# Patient Record
Sex: Male | Born: 1965 | Race: White | Hispanic: No | Marital: Married | Smoking: Never smoker
Health system: Southern US, Community
[De-identification: ages and names within clinical notes are randomized; demographics above are authoritative.]

## PROBLEM LIST (undated history)

## (undated) DIAGNOSIS — T7840XA Allergy, unspecified, initial encounter: Secondary | ICD-10-CM

## (undated) DIAGNOSIS — R200 Anesthesia of skin: Secondary | ICD-10-CM

## (undated) DIAGNOSIS — M545 Low back pain, unspecified: Secondary | ICD-10-CM

## (undated) DIAGNOSIS — G2581 Restless legs syndrome: Secondary | ICD-10-CM

## (undated) HISTORY — DX: Low back pain, unspecified: M54.50

## (undated) HISTORY — PX: TONSILLECTOMY: SHX5217

## (undated) HISTORY — DX: Anesthesia of skin: R20.0

## (undated) HISTORY — DX: Restless legs syndrome: G25.81

## (undated) HISTORY — DX: Allergy, unspecified, initial encounter: T78.40XA

## (undated) HISTORY — PX: OTHER SURGICAL HISTORY: SHX169

## (undated) HISTORY — DX: Low back pain: M54.5

---

## 1999-04-13 ENCOUNTER — Encounter: Admission: RE | Admit: 1999-04-13 | Discharge: 1999-05-11 | Payer: Self-pay | Admitting: Family Medicine

## 2004-02-12 ENCOUNTER — Ambulatory Visit: Payer: Self-pay | Admitting: Family Medicine

## 2004-02-18 ENCOUNTER — Ambulatory Visit: Payer: Self-pay | Admitting: Family Medicine

## 2004-02-22 ENCOUNTER — Ambulatory Visit: Payer: Self-pay | Admitting: Family Medicine

## 2004-07-12 ENCOUNTER — Ambulatory Visit: Payer: Self-pay | Admitting: Family Medicine

## 2004-11-07 ENCOUNTER — Ambulatory Visit: Payer: Self-pay | Admitting: Family Medicine

## 2005-03-29 ENCOUNTER — Ambulatory Visit: Payer: Self-pay | Admitting: Family Medicine

## 2005-04-10 ENCOUNTER — Ambulatory Visit: Payer: Self-pay | Admitting: Family Medicine

## 2005-04-10 ENCOUNTER — Encounter: Admission: RE | Admit: 2005-04-10 | Discharge: 2005-04-10 | Payer: Self-pay | Admitting: Family Medicine

## 2005-09-14 ENCOUNTER — Ambulatory Visit: Payer: Self-pay | Admitting: Family Medicine

## 2006-10-08 DIAGNOSIS — M545 Low back pain, unspecified: Secondary | ICD-10-CM | POA: Insufficient documentation

## 2006-12-17 ENCOUNTER — Telehealth: Payer: Self-pay | Admitting: Family Medicine

## 2006-12-21 ENCOUNTER — Telehealth: Payer: Self-pay | Admitting: Family Medicine

## 2007-01-22 ENCOUNTER — Ambulatory Visit: Payer: Self-pay | Admitting: Family Medicine

## 2007-01-22 LAB — CONVERTED CEMR LAB
AST: 22 units/L (ref 0–37)
Albumin: 4 g/dL (ref 3.5–5.2)
Alkaline Phosphatase: 42 units/L (ref 39–117)
Basophils Absolute: 0 10*3/uL (ref 0.0–0.1)
Bilirubin, Direct: 0.1 mg/dL (ref 0.0–0.3)
Blood in Urine, dipstick: NEGATIVE
CO2: 31 meq/L (ref 19–32)
Cholesterol: 195 mg/dL (ref 0–200)
Eosinophils Relative: 1.7 % (ref 0.0–5.0)
GFR calc Af Amer: 106 mL/min
HCT: 40.9 % (ref 39.0–52.0)
MCHC: 35.2 g/dL (ref 30.0–36.0)
Monocytes Relative: 8.1 % (ref 3.0–11.0)
Neutrophils Relative %: 54.4 % (ref 43.0–77.0)
RBC: 4.66 M/uL (ref 4.22–5.81)
Sodium: 140 meq/L (ref 135–145)
Specific Gravity, Urine: 1.025
TSH: 1.64 microintl units/mL (ref 0.35–5.50)
Total Bilirubin: 0.7 mg/dL (ref 0.3–1.2)
Total Protein: 6.9 g/dL (ref 6.0–8.3)
Triglycerides: 112 mg/dL (ref 0–149)
pH: 7

## 2007-02-08 ENCOUNTER — Ambulatory Visit: Payer: Self-pay | Admitting: Family Medicine

## 2007-02-08 DIAGNOSIS — J309 Allergic rhinitis, unspecified: Secondary | ICD-10-CM | POA: Insufficient documentation

## 2007-02-08 DIAGNOSIS — G2581 Restless legs syndrome: Secondary | ICD-10-CM | POA: Insufficient documentation

## 2007-02-15 ENCOUNTER — Telehealth: Payer: Self-pay | Admitting: Family Medicine

## 2007-12-03 ENCOUNTER — Ambulatory Visit: Payer: Self-pay | Admitting: Family Medicine

## 2007-12-19 IMAGING — CT CT PARANASAL SINUSES LIMITED
1 series · 16 of 28 positions shown, 20 images · IV contrast (agent unspecified)
Comparison: none

CLINICAL DATA: Cough for several weeks. 
 LIMITED CT SCAN OF THE SINUSES WITHOUT CONTRAST:
TECHNIQUE: Limited prone coronal CT images were obtained through the paranasal sinuses without intravenous contrast.

[Series 2: limited sinus · axial · 0.33mm/px · z∈[+18,+116]mm · 16 of 28 slices shown, 20 images]
[im 2/28  brain]
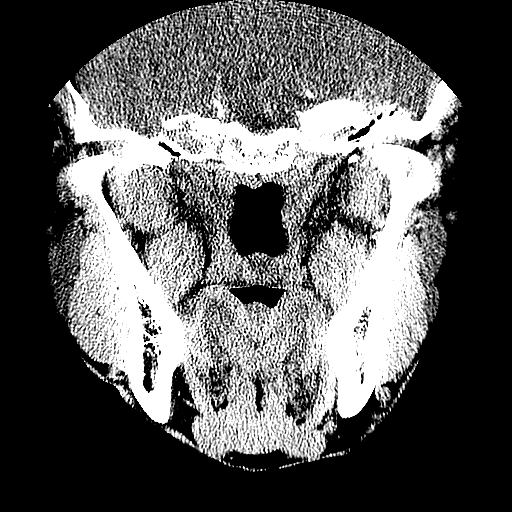
[im 2/28  bone]
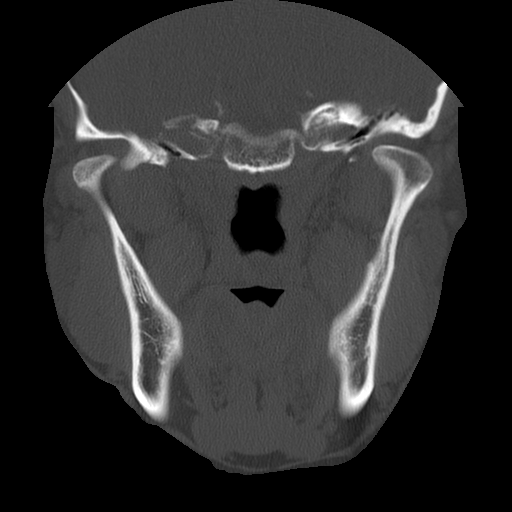
[im 4/28  bone]
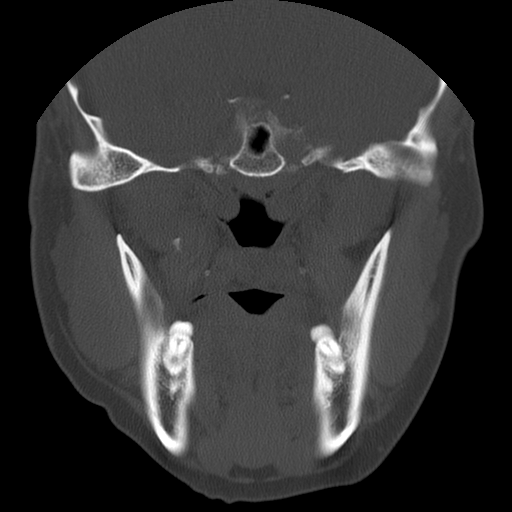
[im 6/28  bone]
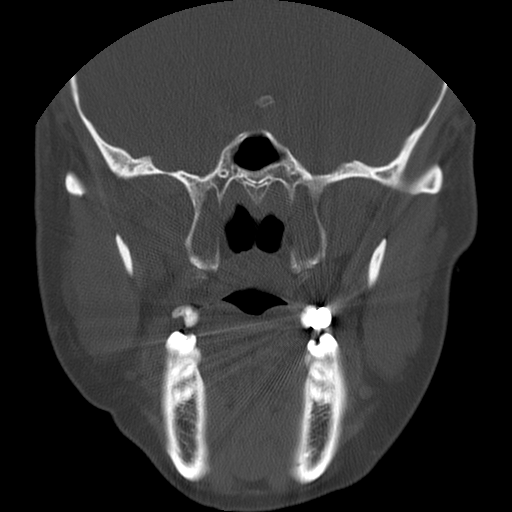
[im 7/28  bone]
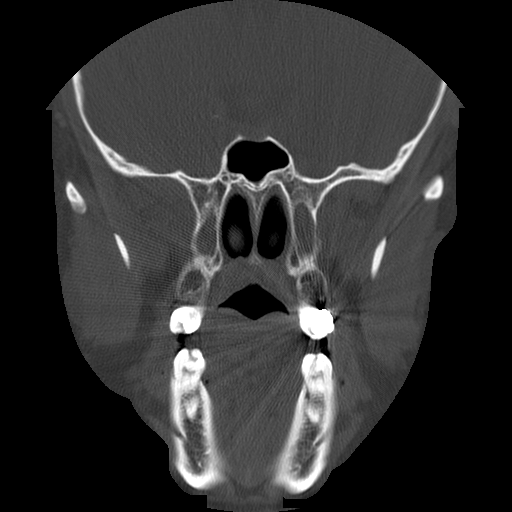
[im 9/28  brain]
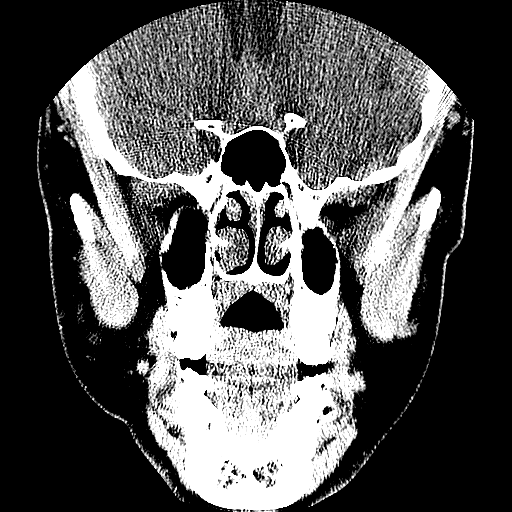
[im 9/28  bone]
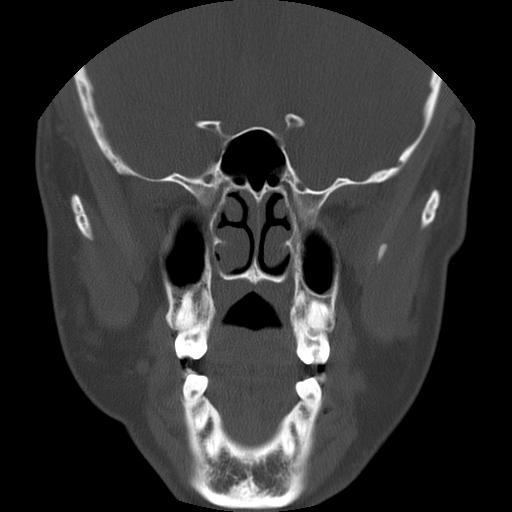
[im 10/28  bone]
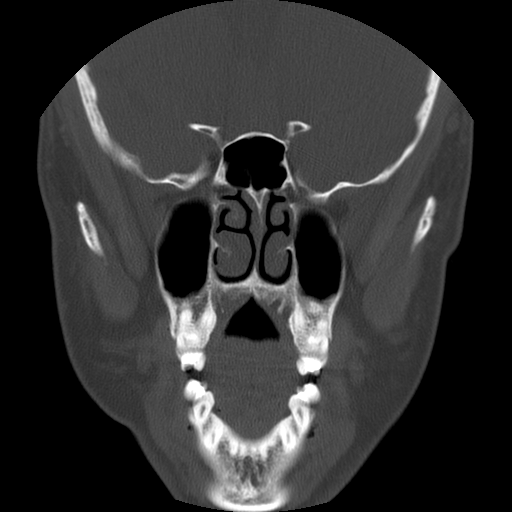
[im 12/28  bone]
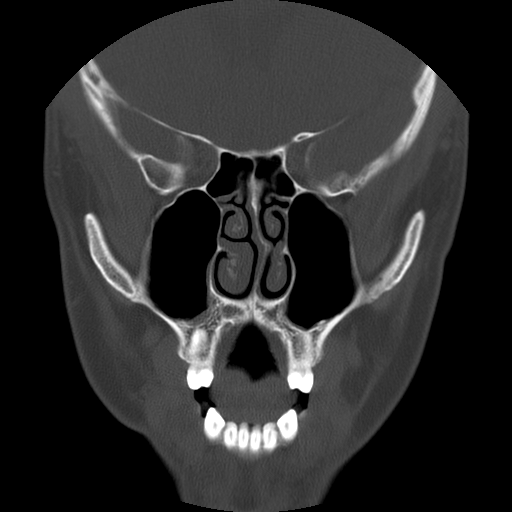
[im 14/28  bone]
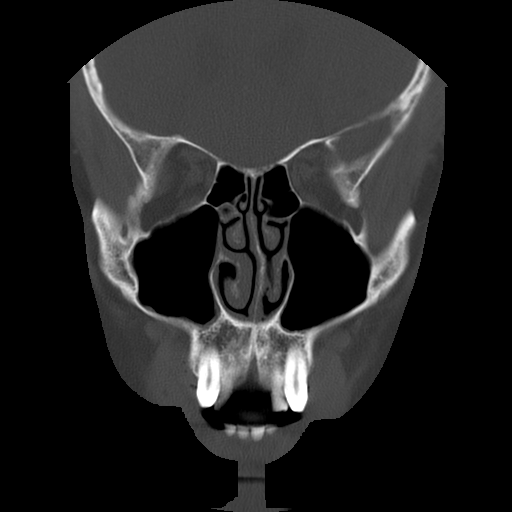
[im 15/28  brain]
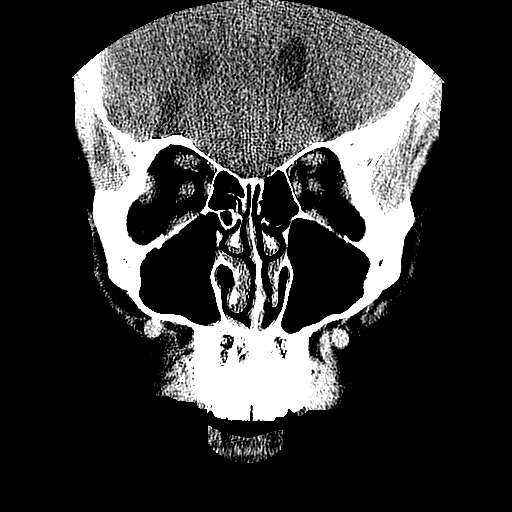
[im 15/28  bone]
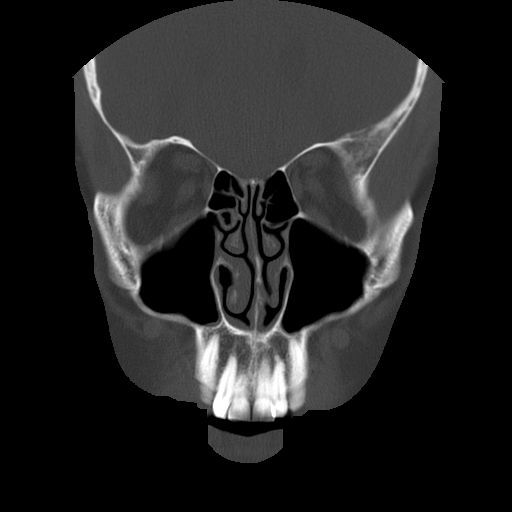
[im 17/28  bone]
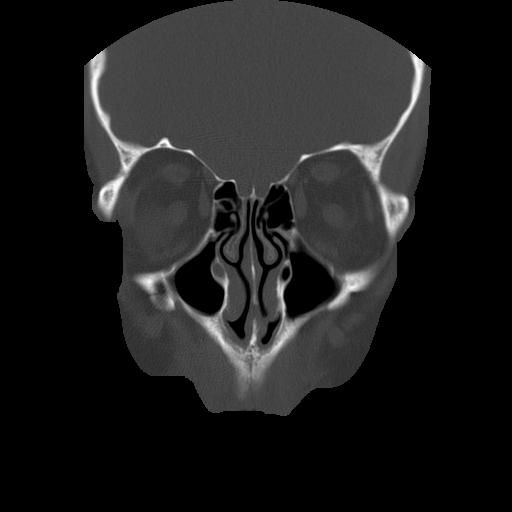
[im 19/28  bone]
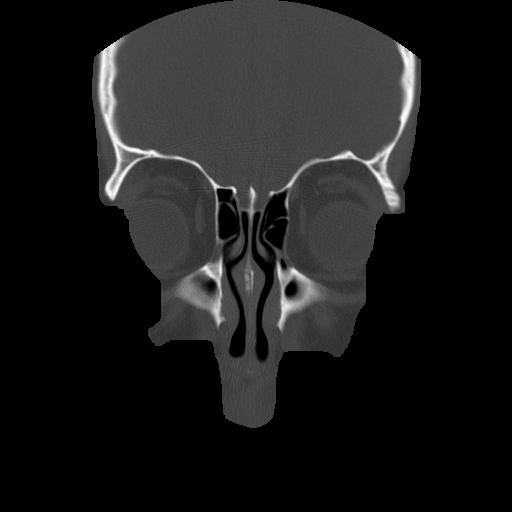
[im 20/28  bone]
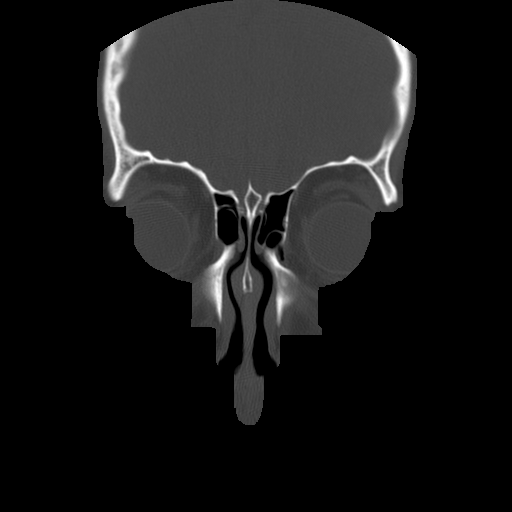
[im 22/28  brain]
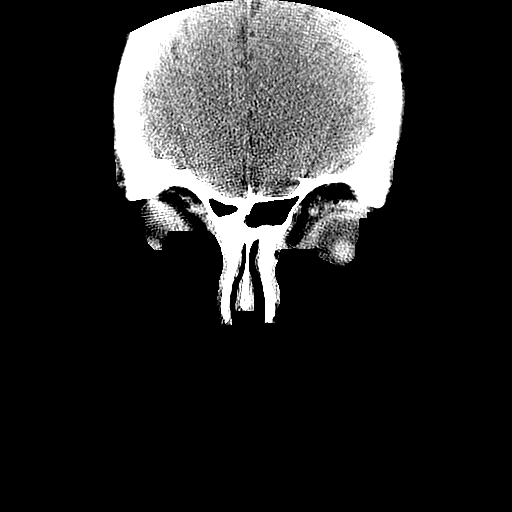
[im 22/28  bone]
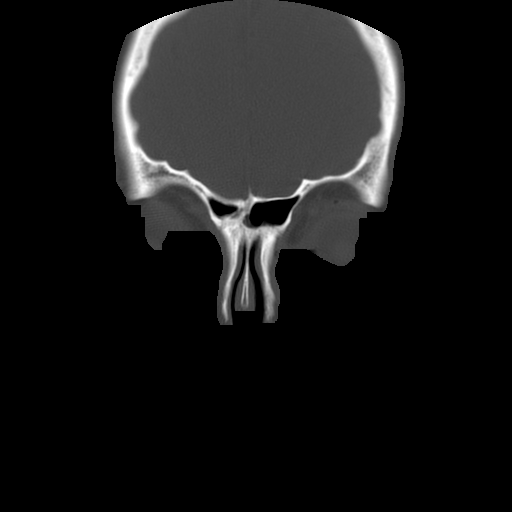
[im 23/28  bone]
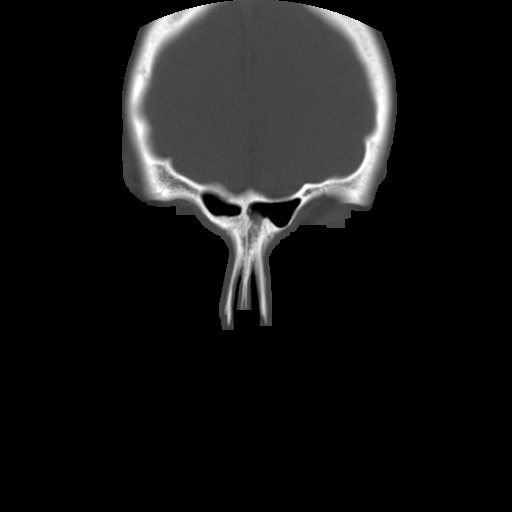
[im 25/28  bone]
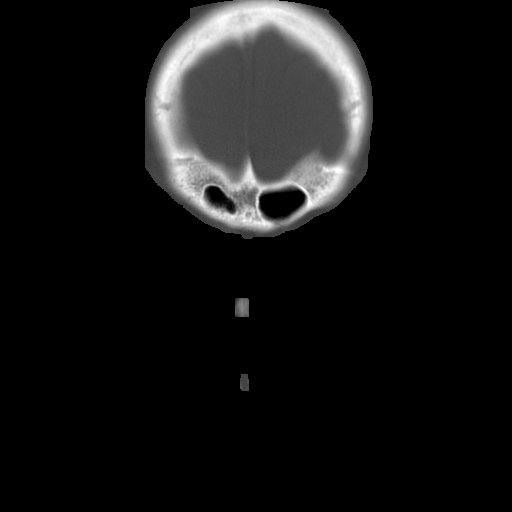
[im 27/28  bone]
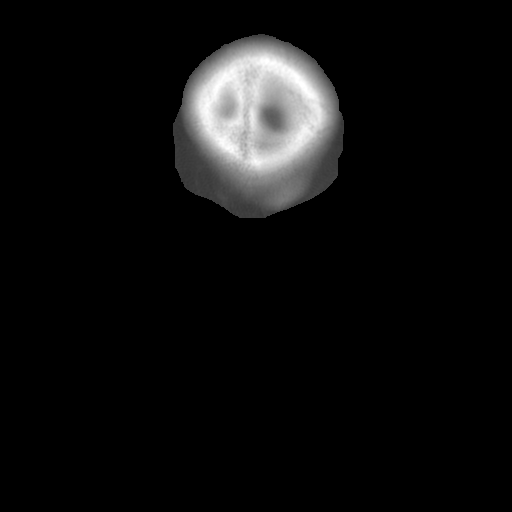

[16 of 28 positions shown; findings below may reference images not displayed]

FINDINGS: There is mucosal thickening in the left maxillary sinus and within a few left ethmoid air cells.  However, no evidence of sinusitis is seen.  The nasal turbinates are normal in size and position, and there is only slight nasal deviation to the right of midline.
IMPRESSION: Mild mucosal thickening in the floor of the left maxillary sinus.  No sinusitis.

## 2008-09-08 ENCOUNTER — Telehealth: Payer: Self-pay | Admitting: Family Medicine

## 2009-12-17 ENCOUNTER — Ambulatory Visit: Payer: Self-pay | Admitting: Family Medicine

## 2009-12-21 ENCOUNTER — Telehealth: Payer: Self-pay | Admitting: Family Medicine

## 2010-03-03 NOTE — Progress Notes (Signed)
Summary: pt no better  Call back at [phone]   Caller: Patient Call For: Roderick Pee MD Summary of Call: Pt was seen on 12-17-2009. pt is not better please advise. [facility] [phone] Initial call taken by: Heron Sabins,  December 21, 2009 3:59 PM  Follow-up for Phone Call       Follow-up by: Roderick Pee MD,  December 21, 2009 6:23 PM  Additional Follow-up for Phone Call Additional follow up Details #1::       Additional Follow-up by: Roderick Pee MD,  December 21, 2009 6:23 PM    Additional Follow-up for Phone Call Additional follow up Details #2::    he states that his cough now is worse.  He has a fever, sputum production, et Karie Soda.  Advised to take a half a teaspoon of Hydromet 3 to 4 times daily.  Increase the prednisone to 20 mg x 3 days and increase to 40 mg over the weekend if he doesn't see improvement in the next couple days.  Call p.r.n. Follow-up by: Roderick Pee MD,  December 21, 2009 6:24 PM

## 2010-03-03 NOTE — Assessment & Plan Note (Signed)
Summary: sinus congestion/dm   Vital Signs:  Patient profile:   45 year old male Weight:      216 pounds Temp:     97.4 degrees F oral BP sitting:   110 / 80  (left arm) Cuff size:   regular  Vitals Entered By: Kern Reap CMA Duncan Dull) (December 17, 2009 3:19 PM) CC: head congestion with deep cough   CC:  head congestion with deep cough.  History of Present Illness: Larry Cunningham is a 45 year old, married male, nonsmoker, who comes in today for evaluation of right ear pain, head congestion, postnasal drip, and cough for 4 weeks.  He typically has some occasional allergic rhinitis, but never this bad.  It is severe.  His ear pain was made worse after an airplane trip  Allergies: No Known Drug Allergies  Past History:  Past medical, surgical, family and social histories (including risk factors) reviewed for relevance to current acute and chronic problems.  Past Medical History: Reviewed history from 02/08/2007 and no changes required. Numbness R Leg Low back pain restless leg syndrome Allergic rhinitis  Past Surgical History: Reviewed history from 10/08/2006 and no changes required. Tubes in Ears Tonsillectomy  Family History: Reviewed history from 10/08/2006 and no changes required. Family History of Arthritis Family History of Asthma Family History of Respiratory disease  Social History: Reviewed history from 02/08/2007 and no changes required. Occupation:business Married Never Smoked Alcohol use-no Drug use-no  Review of Systems      See HPI       Flu Vaccine Consent Questions     Do you have a history of severe allergic reactions to this vaccine? no    Any prior history of allergic reactions to egg and/or gelatin? no    Do you have a sensitivity to the preservative Thimersol? no    Do you have a past history of Guillan-Barre Syndrome? no    Do you currently have an acute febrile illness? no    Have you ever had a severe reaction to latex? no    Vaccine  information given and explained to patient? yes    Are you currently pregnant? no    Lot Number:[id number]   Exp Date:07/30/2010   Site Given  Left Deltoid IM   Physical Exam  General:  Well-developed,well-nourished,in no acute distress; alert,appropriate and cooperative throughout examination Head:  Normocephalic and atraumatic without obvious abnormalities. No apparent alopecia or balding. Eyes:  No corneal or conjunctival inflammation noted. EOMI. Perrla. Funduscopic exam benign, without hemorrhages, exudates or papilledema. Vision grossly normal. Ears:  right TM full fluid.  Left TM normal Nose:  External nasal examination shows no deformity or inflammation. Nasal mucosa are pink and moist without lesions or exudates. Mouth:  Oral mucosa and oropharynx without lesions or exudates.  Teeth in good repair. Neck:  No deformities, masses, or tenderness noted. Chest Wall:  No deformities, masses, tenderness or gynecomastia noted. Lungs:  Normal respiratory effort, chest expands symmetrically. Lungs are clear to auscultation, no crackles or wheezes.   Impression & Recommendations:  Problem # 1:  ALLERGIC RHINITIS (ICD-477.9) Assessment Deteriorated  His updated medication list for this problem includes:    Nasonex 50 Mcg/act Susp (Mometasone furoate) ..... Uad, as needed  uad, as needed  Complete Medication List: 1)  Nasonex 50 Mcg/act Susp (Mometasone furoate) .... Uad, as needed  uad, as needed 2)  Claritin-d 12 Hour 5-120 Mg Xr12h-tab (Loratadine-pseudoephedrine) .... Once daily as needed 3)  Prednisone 20 Mg Tabs (Prednisone) .... Uad 4)  Ropinirole Hcl 1 Mg Tabs (Ropinirole hcl) .... Take one by moth once daily 5)  Hydromet 5-1.5 Mg/45ml Syrp (Hydrocodone-homatropine) .... 1/2 to 1 tsp at bedtime as needed  Other Orders: Admin 1st Vaccine (51025) Flu Vaccine 75yrs + (85277)  Patient Instructions: 1)  take plain Zyrtec at bedtime. 2)  Nasal irrigation with the following.....Marland Kitchen  afrin....... warm salt water..... one shot of steroid nasal spray prior to bedtime.  Remember this a 5 nightly and then on the afrin 3)  Prednisone one tab x 3 days, a half a tablet x 3 days, then half a tablet Monday, Wednesday, Friday, for a two week taper.  Return p.r.n. Prescriptions: HYDROMET 5-1.5 MG/5ML SYRP (HYDROCODONE-HOMATROPINE) 1/2 to 1 tsp at bedtime as needed  #8oz x 1   Entered and Authorized by:   Roderick Pee MD   Signed by:   Roderick Pee MD on 12/17/2009   Method used:   Print then Give to Patient   RxID:   [id number] ROPINIROLE HCL 1 MG TABS (ROPINIROLE HCL) Take one by moth once daily  #100 x 4   Entered and Authorized by:   Roderick Pee MD   Signed by:   Roderick Pee MD on 12/17/2009   Method used:   Electronically to        [facility]  [phone]* (retail)       [address] [place], [place]  [address]       Ph: [phone] or [phone]       Fax: [phone]   RxID:   [id number] PREDNISONE 20 MG TABS (PREDNISONE) UAD  #50 x 1   Entered and Authorized by:   Roderick Pee MD   Signed by:   Roderick Pee MD on 12/17/2009   Method used:   Electronically to        [facility]  [phone]* (retail)       [address] [place], [place]  [address]       Ph: [phone] or [phone]       Fax: [phone]   RxID:   [id number] NASONEX 50 MCG/ACT  SUSP (MOMETASONE FUROATE) UAD, as needed  UAD, as needed  #2 x 4   Entered and Authorized by:   Roderick Pee MD   Signed by:   Roderick Pee MD on 12/17/2009   Method used:   Electronically to        [facility]  [phone]* (retail)       [address] [place], [place]  [address]       Ph: [phone] or [phone]       Fax: [phone]   RxID:   [id number]    Orders Added: 1)  Admin 1st Vaccine [90471] 2)  Flu Vaccine 50yrs + [75916] 3)  Est. Patient Level III [38466]

## 2010-03-16 ENCOUNTER — Encounter: Payer: Self-pay | Admitting: Family Medicine

## 2010-03-17 ENCOUNTER — Ambulatory Visit (INDEPENDENT_AMBULATORY_CARE_PROVIDER_SITE_OTHER): Payer: Self-pay | Admitting: Family Medicine

## 2010-03-17 ENCOUNTER — Encounter: Payer: Self-pay | Admitting: Family Medicine

## 2010-03-17 VITALS — BP 120/84 | Temp 98.0°F | Ht 71.0 in | Wt 217.0 lb

## 2010-03-17 DIAGNOSIS — J309 Allergic rhinitis, unspecified: Secondary | ICD-10-CM

## 2010-03-17 MED ORDER — MOMETASONE FUROATE 50 MCG/ACT NA SUSP: 2.0000 | Freq: Two times a day (BID) | NASAL | Status: DC

## 2010-03-17 MED ORDER — MONTELUKAST SODIUM 10 MG PO TABS: 10.0000 mg | ORAL_TABLET | Freq: Every day | ORAL | Status: DC

## 2010-03-17 NOTE — Progress Notes (Signed)
  Subjective:    Patient ID: Larry Cunningham, male    DOB: [birth year], 45 y.o.   MRN: [id number]  Gweneth Fritter  Is a 45 year old, married man nonsmoker, who comes in today for evaluation of allergic rhinitis.  He is currently taking Claritin, 10 mg daily, and since November has had to take two burst and taper his prednisone x 10 days because of severe symptoms.  He would like to discuss any options.  He has no history of asthma.  Environmental review of systems negative. History reviewed. No pertinent past medical history. History reviewed. No pertinent past surgical history.  reports that he has never smoked. He does not have any smokeless tobacco history on file. He reports that he drinks alcohol. He reports that he does not use illicit drugs. family history is not on file.       Review of Systems     Objective:   Physical Exam  he is a well-developed, well-nourished, male in no acute distress.  Examination of the HEENT were negative.  Echo, supple.  Thyroid not enlarged       Assessment & Plan:  Allergic rhinitis.  Plan is to stop the OTC Claritin, and try plain Zyrtec at bedtime, and increase the steroid nasal spray to one shot up each nostril twice daily and add Singulair 10 mg daily.

## 2010-03-17 NOTE — Patient Instructions (Signed)
Stop the Claritin.  Zyrtec 10 mg plane at bedtime.  Use one shot of the steroid nasal spray twice daily.  Singulair 10 mg daily.  Burst and taper of prednisone 3 to 4 times a year as needed

## 2010-05-24 ENCOUNTER — Telehealth: Payer: Self-pay | Admitting: Family Medicine

## 2010-05-24 MED ORDER — ROPINIROLE HCL 1 MG PO TABS: 1.0000 mg | ORAL_TABLET | Freq: Every day | ORAL | Status: DC

## 2010-05-24 NOTE — Telephone Encounter (Signed)
Pt called req script for generic Requip 1mg  to be sent to [facility]  Fax # [phone]  Id # [phone]

## 2010-05-24 NOTE — Telephone Encounter (Signed)
Pt also would like a 30 day supply for generic Requip to be called in [facility].

## 2010-10-19 ENCOUNTER — Telehealth: Payer: Self-pay | Admitting: Family Medicine

## 2010-10-19 NOTE — Telephone Encounter (Signed)
Wants to get a new rx for Testosterone. [facility]. Thanks.

## 2010-10-20 MED ORDER — TESTOSTERONE 20.25 MG/ACT (1.62%) TD GEL: 2.0000 | TRANSDERMAL | Status: DC

## 2010-10-20 NOTE — Telephone Encounter (Signed)
Ok  X 5 m0nths

## 2010-12-01 ENCOUNTER — Other Ambulatory Visit: Payer: Self-pay | Admitting: *Deleted

## 2010-12-01 MED ORDER — TESTOSTERONE 10 MG/ACT (2%) TD GEL: 2.0000 | Freq: Every day | TRANSDERMAL | Status: DC

## 2011-01-13 ENCOUNTER — Ambulatory Visit (INDEPENDENT_AMBULATORY_CARE_PROVIDER_SITE_OTHER): Payer: BC Managed Care – PPO | Admitting: Family Medicine

## 2011-01-13 ENCOUNTER — Encounter: Payer: Self-pay | Admitting: Family Medicine

## 2011-01-13 DIAGNOSIS — G2581 Restless legs syndrome: Secondary | ICD-10-CM

## 2011-01-13 DIAGNOSIS — R6882 Decreased libido: Secondary | ICD-10-CM

## 2011-01-13 DIAGNOSIS — R5383 Other fatigue: Secondary | ICD-10-CM | POA: Insufficient documentation

## 2011-01-13 DIAGNOSIS — J309 Allergic rhinitis, unspecified: Secondary | ICD-10-CM

## 2011-01-13 LAB — HEPATIC FUNCTION PANEL
ALT: 17 U/L (ref 0–53)
Albumin: 4.4 g/dL (ref 3.5–5.2)
Bilirubin, Direct: 0.1 mg/dL (ref 0.0–0.3)
Total Protein: 7.2 g/dL (ref 6.0–8.3)

## 2011-01-13 LAB — POCT URINALYSIS DIPSTICK
Glucose, UA: NEGATIVE
Ketones, UA: NEGATIVE
Nitrite, UA: NEGATIVE
Urobilinogen, UA: 0.2

## 2011-01-13 LAB — CBC WITH DIFFERENTIAL/PLATELET
Basophils Relative: 0.7 % (ref 0.0–3.0)
Eosinophils Absolute: 0.1 10*3/uL (ref 0.0–0.7)
HCT: 41.9 % (ref 39.0–52.0)
MCHC: 34.5 g/dL (ref 30.0–36.0)
Monocytes Absolute: 0.4 10*3/uL (ref 0.1–1.0)
Neutro Abs: 3.7 10*3/uL (ref 1.4–7.7)
Neutrophils Relative %: 57.3 % (ref 43.0–77.0)
Platelets: 293 10*3/uL (ref 150.0–400.0)
RBC: 4.69 Mil/uL (ref 4.22–5.81)
RDW: 12.4 % (ref 11.5–14.6)

## 2011-01-13 LAB — BASIC METABOLIC PANEL
CO2: 33 mEq/L — ABNORMAL HIGH (ref 19–32)
Chloride: 102 mEq/L (ref 96–112)
Creatinine, Ser: 1 mg/dL (ref 0.4–1.5)
GFR: 89.21 mL/min (ref >60.00)

## 2011-01-13 LAB — LIPID PANEL: VLDL: 22 mg/dL (ref 0.0–40.0)

## 2011-01-13 LAB — LDL CHOLESTEROL, DIRECT: Direct LDL: 133.8 mg/dL

## 2011-01-13 MED ORDER — PREDNISONE 20 MG PO TABS: ORAL_TABLET | ORAL | Status: DC

## 2011-01-13 MED ORDER — MONTELUKAST SODIUM 10 MG PO TABS: 10.0000 mg | ORAL_TABLET | Freq: Every day | ORAL | Status: DC

## 2011-01-13 MED ORDER — ROPINIROLE HCL 1 MG PO TABS: 1.0000 mg | ORAL_TABLET | Freq: Every day | ORAL | Status: DC

## 2011-01-13 MED ORDER — MOMETASONE FUROATE 50 MCG/ACT NA SUSP: 2.0000 | Freq: Two times a day (BID) | NASAL | Status: DC

## 2011-01-13 NOTE — Progress Notes (Signed)
  Subjective:    Patient ID: Larry Cunningham, male    DOB: [birth year], 45 y.o.   MRN: [id number]  HPI Larry Cunningham Is a 45 year old male, who comes in today for evaluation of fatigue, decreased libido, and decreased sexual function, over the last couple months.  Review of systems otherwise negative.  He uses Nasonex for allergic rhinitis, and Singulair 10 mg daily.  Also, an occasional burst of prednisone that he has asthma.  Had a spell 3 weeks ago took his prednisone and the wheezing went away.  He also takes equip 1 mg at bedtime for restless leg syndrome   Review of Systems    General and urologic review of systems otherwise negative Objective:   Physical Exam  Well-developed well-nourished, male in no acute distress      Assessment & Plan:  Fatigue with decreased libido, check testosterone level

## 2011-01-13 NOTE — Patient Instructions (Signed)
I will call you when I get her lab work back.  Set up a time in January for a 30 minute appointment for general physical exam

## 2011-02-18 ENCOUNTER — Encounter: Payer: Self-pay | Admitting: Family Medicine

## 2011-02-21 ENCOUNTER — Encounter: Payer: Self-pay | Admitting: Family Medicine

## 2011-02-21 ENCOUNTER — Ambulatory Visit (INDEPENDENT_AMBULATORY_CARE_PROVIDER_SITE_OTHER): Payer: BC Managed Care – PPO | Admitting: Family Medicine

## 2011-02-21 DIAGNOSIS — J309 Allergic rhinitis, unspecified: Secondary | ICD-10-CM

## 2011-02-21 DIAGNOSIS — G2581 Restless legs syndrome: Secondary | ICD-10-CM

## 2011-02-21 DIAGNOSIS — Z0000 Encounter for general adult medical examination without abnormal findings: Secondary | ICD-10-CM

## 2011-02-21 NOTE — Progress Notes (Signed)
  Subjective:    Patient ID: MD SMOLA, male    DOB: 1965/08/19, 46 y.o.   MRN: [id number]  HPI [name] he will is a 46 year old, married male, nonsmoker, who comes in today for general physical examination  He has a history of underlying allergic rhinitis, for which he takes steroid nasal spray And Singulair 10 mg daily.  He takes frequent 1 mg at bedtime for restless leg syndrome.  There was an issue with fatigue.  He did have a look.  Testosterone level, but then on repeat in December.  His testosterone level was normal   Review of Systems  Constitutional: Negative.   HENT: Negative.   Eyes: Negative.   Respiratory: Negative.   Cardiovascular: Negative.   Gastrointestinal: Negative.   Genitourinary: Negative.   Musculoskeletal: Negative.   Skin: Negative.   Neurological: Negative.   Hematological: Negative.   Psychiatric/Behavioral: Negative.        Objective:   Physical Exam  Constitutional: He is oriented to person, place, and time. He appears well-developed and well-nourished.  HENT:  Head: Normocephalic and atraumatic.  Right Ear: External ear normal.  Left Ear: External ear normal.  Nose: Nose normal.  Mouth/Throat: Oropharynx is clear and moist.  Eyes: Conjunctivae and EOM are normal. Pupils are equal, round, and reactive to light.  Neck: Normal range of motion. Neck supple. No JVD present. No tracheal deviation present. No thyromegaly present.  Cardiovascular: Normal rate, regular rhythm, normal heart sounds and intact distal pulses.  Exam reveals no gallop and no friction rub.   No murmur heard. Pulmonary/Chest: Effort normal and breath sounds normal. No stridor. No respiratory distress. He has no wheezes. He has no rales. He exhibits no tenderness.  Abdominal: Soft. Bowel sounds are normal. He exhibits no distension and no mass. There is no tenderness. There is no rebound and no guarding.  Genitourinary: Rectum normal, prostate normal and penis normal. Guaiac  negative stool. No penile tenderness.  Musculoskeletal: Normal range of motion. He exhibits no edema and no tenderness.  Lymphadenopathy:    He has no cervical adenopathy.  Neurological: He is alert and oriented to person, place, and time. He has normal reflexes. No cranial nerve deficit. He exhibits normal muscle tone.  Skin: Skin is warm and dry. No rash noted. No erythema. No pallor.  Psychiatric: He has a normal mood and affect. His behavior is normal. Judgment and thought content normal.          Assessment & Plan:  Healthy male.  Allergic rhinitis.  Continue Nasonex, and Singulair.  History of restless leg syndrome continue Requip.  Return in one year, sooner if any problems

## 2011-02-21 NOTE — Patient Instructions (Signed)
Continue your current medications.  Follow-up in one year, sooner if any problems 

## 2011-06-13 ENCOUNTER — Other Ambulatory Visit: Payer: Self-pay | Admitting: Family Medicine

## 2012-01-02 ENCOUNTER — Other Ambulatory Visit: Payer: Self-pay | Admitting: Family Medicine

## 2012-06-03 ENCOUNTER — Other Ambulatory Visit: Payer: Self-pay | Admitting: Family Medicine

## 2013-10-30 ENCOUNTER — Other Ambulatory Visit (INDEPENDENT_AMBULATORY_CARE_PROVIDER_SITE_OTHER): Payer: BC Managed Care – PPO

## 2013-10-30 DIAGNOSIS — Z0000 Encounter for general adult medical examination without abnormal findings: Secondary | ICD-10-CM

## 2013-10-30 LAB — CBC WITH DIFFERENTIAL/PLATELET
Basophils Absolute: 0 10*3/uL (ref 0.0–0.1)
Basophils Relative: 0.4 % (ref 0.0–3.0)
EOS PCT: 2 % (ref 0.0–5.0)
Eosinophils Absolute: 0.1 10*3/uL (ref 0.0–0.7)
HCT: 40.2 % (ref 39.0–52.0)
HEMOGLOBIN: 13.7 g/dL (ref 13.0–17.0)
LYMPHS PCT: 36.2 % (ref 12.0–46.0)
Lymphs Abs: 1.5 10*3/uL (ref 0.7–4.0)
MCHC: 34.1 g/dL (ref 30.0–36.0)
MCV: 89.3 fl (ref 78.0–100.0)
MONOS PCT: 6.8 % (ref 3.0–12.0)
Monocytes Absolute: 0.3 10*3/uL (ref 0.1–1.0)
NEUTROS PCT: 54.6 % (ref 43.0–77.0)
Neutro Abs: 2.3 10*3/uL (ref 1.4–7.7)
Platelets: 292 10*3/uL (ref 150.0–400.0)
RBC: 4.5 Mil/uL (ref 4.22–5.81)
RDW: 13.6 % (ref 11.5–15.5)
WBC: 4.1 10*3/uL (ref 4.0–10.5)

## 2013-10-30 LAB — POCT URINALYSIS DIPSTICK
Bilirubin, UA: NEGATIVE
Blood, UA: NEGATIVE
Glucose, UA: NEGATIVE
Ketones, UA: NEGATIVE
Leukocytes, UA: NEGATIVE
NITRITE UA: NEGATIVE
PH UA: 7
PROTEIN UA: NEGATIVE
SPEC GRAV UA: 1.02
Urobilinogen, UA: 0.2

## 2013-10-30 LAB — BASIC METABOLIC PANEL
BUN: 20 mg/dL (ref 6–23)
CHLORIDE: 104 meq/L (ref 96–112)
CO2: 28 mEq/L (ref 19–32)
Calcium: 9 mg/dL (ref 8.4–10.5)
Creatinine, Ser: 1.1 mg/dL (ref 0.4–1.5)
GFR: 73.88 mL/min (ref >60.00)
GLUCOSE: 96 mg/dL (ref 70–99)
POTASSIUM: 4.4 meq/L (ref 3.5–5.1)
Sodium: 141 mEq/L (ref 135–145)

## 2013-10-30 LAB — HEPATIC FUNCTION PANEL
ALBUMIN: 4.3 g/dL (ref 3.5–5.2)
ALK PHOS: 37 U/L — AB (ref 39–117)
ALT: 13 U/L (ref 0–53)
AST: 21 U/L (ref 0–37)
Bilirubin, Direct: 0 mg/dL (ref 0.0–0.3)
TOTAL PROTEIN: 7.3 g/dL (ref 6.0–8.3)
Total Bilirubin: 0.5 mg/dL (ref 0.2–1.2)

## 2013-10-30 LAB — LIPID PANEL
Cholesterol: 202 mg/dL — ABNORMAL HIGH (ref 0–200)
HDL: 60.1 mg/dL (ref >39.00)
LDL Cholesterol: 124 mg/dL — ABNORMAL HIGH (ref 0–99)
NonHDL: 141.9
Total CHOL/HDL Ratio: 3
Triglycerides: 92 mg/dL (ref 0.0–149.0)
VLDL: 18.4 mg/dL (ref 0.0–40.0)

## 2013-10-30 LAB — TSH: TSH: 1.3 u[IU]/mL (ref 0.35–4.50)

## 2013-11-10 ENCOUNTER — Encounter: Payer: Self-pay | Admitting: Family Medicine

## 2013-11-10 ENCOUNTER — Ambulatory Visit (INDEPENDENT_AMBULATORY_CARE_PROVIDER_SITE_OTHER): Payer: BC Managed Care – PPO | Admitting: Family Medicine

## 2013-11-10 VITALS — BP 114/88 | HR 88 | Temp 97.6°F | Ht 70.0 in | Wt 207.4 lb

## 2013-11-10 DIAGNOSIS — G4733 Obstructive sleep apnea (adult) (pediatric): Secondary | ICD-10-CM | POA: Insufficient documentation

## 2013-11-10 DIAGNOSIS — G4710 Hypersomnia, unspecified: Secondary | ICD-10-CM

## 2013-11-10 DIAGNOSIS — G2581 Restless legs syndrome: Secondary | ICD-10-CM

## 2013-11-10 DIAGNOSIS — G4730 Sleep apnea, unspecified: Secondary | ICD-10-CM

## 2013-11-10 DIAGNOSIS — J301 Allergic rhinitis due to pollen: Secondary | ICD-10-CM

## 2013-11-10 MED ORDER — MOMETASONE FUROATE 50 MCG/ACT NA SUSP: 2.0000 | Freq: Two times a day (BID) | NASAL | Status: AC

## 2013-11-10 MED ORDER — MONTELUKAST SODIUM 10 MG PO TABS: 10.0000 mg | ORAL_TABLET | Freq: Every day | ORAL | Status: AC

## 2013-11-10 MED ORDER — ROPINIROLE HCL 1 MG PO TABS: ORAL_TABLET | ORAL | Status: DC

## 2013-11-10 NOTE — Progress Notes (Signed)
Pre visit review using our clinic review tool, if applicable. No additional management support is needed unless otherwise documented below in the visit note. 

## 2013-11-10 NOTE — Progress Notes (Signed)
   Subjective:    Patient ID: Larry Cunningham, male    DOB: 01/07/1966, 48 y.o.   MRN: [id number]  HPI Larry BonusShad is a 48 year old married male nonsmoker who comes in today for evaluation  He has underlying allergic rhinitis for which he uses steroid nasal spray Singulair 10 mg daily.  He takes Requip 1 mg daily for restless leg syndrome there  Recently over the past couple months he is having more issues with snoring even on his side. His wife is concerned he may have sleep apnea.  He gets routine eye care, dental care, colonoscopy at age 48 no family history colon cancer or polyps   Review of Systems  Constitutional: Negative.   HENT: Negative.   Eyes: Negative.   Respiratory: Negative.   Cardiovascular: Negative.   Gastrointestinal: Negative.   Endocrine: Negative.   Genitourinary: Negative.   Musculoskeletal: Negative.   Skin: Negative.   Allergic/Immunologic: Negative.   Neurological: Negative.   Hematological: Negative.   Psychiatric/Behavioral: Negative.        Objective:   Physical Exam  Nursing note and vitals reviewed. Constitutional: He is oriented to person, place, and time. He appears well-developed and well-nourished.  HENT:  Head: Normocephalic and atraumatic.  Right Ear: External ear normal.  Left Ear: External ear normal.  Nose: Nose normal.  Mouth/Throat: Oropharynx is clear and moist.  Eyes: Conjunctivae and EOM are normal. Pupils are equal, round, and reactive to light.  Neck: Normal range of motion. Neck supple. No JVD present. No tracheal deviation present. No thyromegaly present.  Cardiovascular: Normal rate, regular rhythm, normal heart sounds and intact distal pulses.  Exam reveals no gallop and no friction rub.   No murmur heard. Pulmonary/Chest: Effort normal and breath sounds normal. No stridor. No respiratory distress. He has no wheezes. He has no rales. He exhibits no tenderness.  Abdominal: Soft. Bowel sounds are normal. He exhibits no distension  and no mass. There is no tenderness. There is no rebound and no guarding.  Genitourinary: Rectum normal, prostate normal and penis normal. Guaiac negative stool. No penile tenderness.  Musculoskeletal: Normal range of motion. He exhibits no edema and no tenderness.  Lymphadenopathy:    He has no cervical adenopathy.  Neurological: He is alert and oriented to person, place, and time. He has normal reflexes. No cranial nerve deficit. He exhibits normal muscle tone.  Skin: Skin is warm and dry. No rash noted. No erythema. No pallor.  Psychiatric: He has a normal mood and affect. His behavior is normal. Judgment and thought content normal.          Assessment & Plan:  Healthy male  Allergic rhinitis continue current medications  Restless leg syndrome continue Requip  Question sleep apnea consult with Dr. Shelle Ironclance

## 2013-11-10 NOTE — Patient Instructions (Signed)
Continue your current medications  We will set you up a consult in pulmonary to see Dr. Mellody DanceKeith clance and his associates for evaluation of question sleep apnea

## 2013-12-19 ENCOUNTER — Institutional Professional Consult (permissible substitution): Payer: BC Managed Care – PPO | Admitting: Pulmonary Disease

## 2013-12-30 ENCOUNTER — Ambulatory Visit (INDEPENDENT_AMBULATORY_CARE_PROVIDER_SITE_OTHER): Payer: BC Managed Care – PPO | Admitting: Pulmonary Disease

## 2013-12-30 ENCOUNTER — Encounter: Payer: Self-pay | Admitting: Pulmonary Disease

## 2013-12-30 ENCOUNTER — Encounter (INDEPENDENT_AMBULATORY_CARE_PROVIDER_SITE_OTHER): Payer: Self-pay

## 2013-12-30 DIAGNOSIS — R0683 Snoring: Secondary | ICD-10-CM

## 2013-12-30 NOTE — Progress Notes (Signed)
   Subjective:    Patient ID: Larry Cunningham, male    DOB: 01/20/1966, 48 y.o.   MRN: [id number]  HPI The patient is a 48 year old male who I've been asked to see for possible obstructive sleep apnea. He has been noted to have snoring by his bed partner, as well as an occasional abnormal breathing pattern during sleep. The patient typically only gets approximately 5 hours of sleep a night, and denies having frequent awakenings. Overall, he feels that he is adequately rested in the mornings upon arising, but does admit to having sleep pressure during the day with inactivity at times. He also gets very sleepy in the evenings trying to watch television or movies, and will actually fall asleep. Hedeniesanysleepinesswithshortdrives,but does get sleepy driving longer distances.  Complicating all of this, the patient leads a very busy life with his job, and often travels across multiple times zones with very little sleep. He also has a history of restless leg syndrome, but it no longer bothers his wife since getting a King bed, and it rarely disrupts his sleep.  He has tried Requip in the past, but did not feel that his symptoms were significant enough to justify a nightly medication. Of note, the patient's weight is been stable over the last few years, and his Epworth score today is 7.   Review of Systems  Constitutional: Negative for fever and unexpected weight change.  HENT: Negative for congestion, dental problem, ear pain, nosebleeds, postnasal drip, rhinorrhea, sinus pressure, sneezing, sore throat and trouble swallowing.   Eyes: Negative for redness and itching.  Respiratory: Negative for cough, chest tightness, shortness of breath and wheezing.   Cardiovascular: Negative for palpitations and leg swelling.  Gastrointestinal: Negative for nausea and vomiting.  Genitourinary: Negative for dysuria.  Musculoskeletal: Negative for joint swelling.  Skin: Negative for rash.  Neurological: Negative for  headaches.  Hematological: Does not bruise/bleed easily.  Psychiatric/Behavioral: Negative for dysphoric mood. The patient is not nervous/anxious.        Objective:   Physical Exam Constitutional:  Well developed, no acute distress  HENT:  Nares patent without discharge, mildly narrowed.  Oropharynx without exudate, palate and uvula are mildly elongated.  Eyes:  Perrla, eomi, no scleral icterus  Neck:  No JVD, no TMG  Cardiovascular:  Normal rate, regular rhythm, no rubs or gallops.  No murmurs        Intact distal pulses  Pulmonary :  Normal breath sounds, no stridor or respiratory distress   No rales, rhonchi, or wheezing  Abdominal:  Soft, nondistended, bowel sounds present.  No tenderness noted.   Musculoskeletal:  No lower extremity edema noted.  Lymph Nodes:  No cervical lymphadenopathy noted  Skin:  No cyanosis noted  Neurologic:  Appears mildly sleep, appropriate, moves all 4 extremities without obvious deficit.         Assessment & Plan:

## 2013-12-30 NOTE — Assessment & Plan Note (Signed)
The patient has a history of significant snoring and also witnessed apneas at times. The patient does describe some sleep pressure during the day with inactivity and also in the evenings.  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 It is unclear from his history whether he has clinically significant sleep disordered breathing or not. I think he would benefit from a home sleep test to put the issue to rest, with the main goal being to exclude moderate to severe sleep apnea. The patient is agreeable to this approach.

## 2013-12-30 NOTE — Patient Instructions (Signed)
Will schedule for home sleep testing, and will call once the results are available.  Work on modest weight loss.

## 2014-03-23 ENCOUNTER — Ambulatory Visit (INDEPENDENT_AMBULATORY_CARE_PROVIDER_SITE_OTHER): Payer: BLUE CROSS/BLUE SHIELD | Admitting: Family Medicine

## 2014-03-23 ENCOUNTER — Encounter: Payer: Self-pay | Admitting: Family Medicine

## 2014-03-23 VITALS — BP 140/84 | Temp 97.8°F | Wt 213.0 lb

## 2014-03-23 DIAGNOSIS — B9789 Other viral agents as the cause of diseases classified elsewhere: Principal | ICD-10-CM

## 2014-03-23 DIAGNOSIS — J069 Acute upper respiratory infection, unspecified: Secondary | ICD-10-CM | POA: Insufficient documentation

## 2014-03-23 MED ORDER — HYDROCODONE-HOMATROPINE 5-1.5 MG/5ML PO SYRP: 5.0000 mL | ORAL_SOLUTION | Freq: Three times a day (TID) | ORAL | Status: DC | PRN

## 2014-03-23 MED ORDER — AZITHROMYCIN 250 MG PO TABS: ORAL_TABLET | ORAL | Status: DC

## 2014-03-23 NOTE — Progress Notes (Signed)
Pre visit review using our clinic review tool, if applicable. No additional management support is needed unless otherwise documented below in the visit note. 

## 2014-03-23 NOTE — Progress Notes (Signed)
   Subjective:    Patient ID: Larry Cunningham, male    DOB: 10/15/1965, 49 y.o.   MRN: [id number]  HPI [name] is a 49 year old married male nonsmoker who comes in today with a five-day history of a congestion sore throat and runny nose and cough. He's also had some fever and chills.  He's going to [place] on Sunday business   Review of Systems Review of systems otherwise negative    Objective:   Physical Exam  Well-developed well-nourished male no acute distress vital signs stable he is afebrile HEENT were negative neck was supple no adenopathy lungs are clear      Assessment & Plan:  Viral syndrome with cough.......... treat symptomatically lots of liquids Tylenol and cough syrup

## 2014-03-23 NOTE — Patient Instructions (Signed)
Drink lots of water  Hydromet......Marland Kitchen. 1/2-1 teaspoon 3 times daily. For cough and cold  Take these azithromycin with you on your trip........Marland Kitchen. 1 daily for 10 days when necessary  Also take the prednisone with you just in case she start wheezing  Afrin nasal spray and chewing gum

## 2014-03-25 ENCOUNTER — Telehealth: Payer: Self-pay | Admitting: Pulmonary Disease

## 2014-03-25 NOTE — Telephone Encounter (Signed)
Pt states that he is not going to make it to pick up his home sleep test machine today. Pt was scheduled to meet Alida at 11:00 today to pick up machine. This needs to be rescheduled.  Will send to Alida to contact patient to reschedule.   Please advise, thanks.

## 2014-03-26 NOTE — Telephone Encounter (Signed)
Spoke with patient who had days mixed up was scheduled for today 03/26/14 @ 11am.  [facility] still not feeling well [facility]  .Kandice HamsAlida Crystalann Korf

## 2014-04-06 DIAGNOSIS — G4733 Obstructive sleep apnea (adult) (pediatric): Secondary | ICD-10-CM

## 2014-04-09 ENCOUNTER — Other Ambulatory Visit: Payer: Self-pay | Admitting: *Deleted

## 2014-04-09 ENCOUNTER — Telehealth: Payer: Self-pay | Admitting: Pulmonary Disease

## 2014-04-09 DIAGNOSIS — G4733 Obstructive sleep apnea (adult) (pediatric): Secondary | ICD-10-CM

## 2014-04-09 DIAGNOSIS — R0683 Snoring: Secondary | ICD-10-CM

## 2014-04-09 NOTE — Telephone Encounter (Signed)
Needs ov to review sleep study 

## 2014-04-09 NOTE — Telephone Encounter (Signed)
Pt called back and appt scheduled fro 3/18.

## 2014-04-09 NOTE — Telephone Encounter (Signed)
lmomtcb x1 on both #'s.

## 2014-04-17 ENCOUNTER — Encounter: Payer: Self-pay | Admitting: Pulmonary Disease

## 2014-04-17 ENCOUNTER — Ambulatory Visit (INDEPENDENT_AMBULATORY_CARE_PROVIDER_SITE_OTHER): Payer: BLUE CROSS/BLUE SHIELD | Admitting: Pulmonary Disease

## 2014-04-17 VITALS — BP 132/74 | HR 80 | Temp 97.0°F | Ht 71.0 in | Wt 205.2 lb

## 2014-04-17 DIAGNOSIS — G4733 Obstructive sleep apnea (adult) (pediatric): Secondary | ICD-10-CM

## 2014-04-17 NOTE — Progress Notes (Signed)
   Subjective:    Patient ID: Larry Cunningham, male    DOB: 04/04/1965, 49 y.o.   MRN: [id number]  HPI The patient comes in today for follow-up of his recent home sleep test. He was found to have mild obstructive sleep apnea, with an AHI of 13 events per hour and transient oxygen desaturation. I have reviewed the study with him in detail, and answered all of his questions.   Review of Systems  Constitutional: Negative for fever, chills, activity change, appetite change and unexpected weight change.  HENT: Negative for congestion, dental problem, postnasal drip, rhinorrhea, sneezing, sore throat, trouble swallowing and voice change.   Eyes: Negative for visual disturbance.  Respiratory: Negative for cough, choking and shortness of breath.   Cardiovascular: Negative for chest pain and leg swelling.  Gastrointestinal: Negative for nausea, vomiting and abdominal pain.  Genitourinary: Negative for difficulty urinating.  Musculoskeletal: Negative for arthralgias.  Skin: Negative for rash.  Psychiatric/Behavioral: Negative for behavioral problems and confusion.       Objective:   Physical Exam Overweight male in no acute distress Nose without purulence or discharge noted Neck without lymphadenopathy or thyromegaly Lower extremities without edema, no cyanosis Alert and oriented, moves all 4 extremities.       Assessment & Plan:

## 2014-04-17 NOTE — Assessment & Plan Note (Signed)
The patient's home sleep test shows mild obstructive sleep apnea, and I have reviewed the results with him. Treatment for this degree of sleep apnea should only depend upon its impact to his quality of life, since it is not a significant cardiovascular health issue. I have outlined a conservative approach with a trial of weight loss alone, versus a more aggressive approach with a dental appliance or CPAP. 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 For now, he would like to work on weight loss alone, and if he has difficulties, he will consider a dental appliance. He knows to call me if he wishes a referral to dental medicine.

## 2014-04-17 NOTE — Patient Instructions (Signed)
Work on weight reduction, but call if you wish to consider a dental appliance

## 2014-11-13 ENCOUNTER — Telehealth: Payer: Self-pay | Admitting: Family Medicine

## 2014-11-13 NOTE — Telephone Encounter (Signed)
Pt states he needs cpe by the end of the year.  Pt does not want to switch pcp at this time. Prefers I ask dr todd to see him before the end of the year.  Pt aware dr todd out until Monday and he is ok with that.

## 2014-11-16 NOTE — Telephone Encounter (Signed)
Pt is aware of message

## 2014-11-16 NOTE — Telephone Encounter (Signed)
Left message on voice mail  to call back

## 2014-11-16 NOTE — Telephone Encounter (Signed)
No work-ins per Dr Tawanna Coolerodd.  I can refill his medication if needed until his appointment

## 2017-10-29 DIAGNOSIS — K5730 Diverticulosis of large intestine without perforation or abscess without bleeding: Secondary | ICD-10-CM | POA: Diagnosis not present

## 2017-10-29 DIAGNOSIS — Z1211 Encounter for screening for malignant neoplasm of colon: Secondary | ICD-10-CM | POA: Diagnosis not present

## 2018-01-17 DIAGNOSIS — Z125 Encounter for screening for malignant neoplasm of prostate: Secondary | ICD-10-CM | POA: Diagnosis not present

## 2018-01-17 DIAGNOSIS — Z13220 Encounter for screening for lipoid disorders: Secondary | ICD-10-CM | POA: Diagnosis not present

## 2018-01-17 DIAGNOSIS — Z23 Encounter for immunization: Secondary | ICD-10-CM | POA: Diagnosis not present

## 2018-01-17 DIAGNOSIS — Z0000 Encounter for general adult medical examination without abnormal findings: Secondary | ICD-10-CM | POA: Diagnosis not present

## 2018-08-27 DIAGNOSIS — L0390 Cellulitis, unspecified: Secondary | ICD-10-CM | POA: Diagnosis not present

## 2018-08-27 DIAGNOSIS — T63441A Toxic effect of venom of bees, accidental (unintentional), initial encounter: Secondary | ICD-10-CM | POA: Diagnosis not present

## 2019-01-02 DIAGNOSIS — E7800 Pure hypercholesterolemia, unspecified: Secondary | ICD-10-CM | POA: Diagnosis not present

## 2019-01-02 DIAGNOSIS — Z125 Encounter for screening for malignant neoplasm of prostate: Secondary | ICD-10-CM | POA: Diagnosis not present

## 2019-01-02 DIAGNOSIS — Z0000 Encounter for general adult medical examination without abnormal findings: Secondary | ICD-10-CM | POA: Diagnosis not present

## 2019-03-14 ENCOUNTER — Ambulatory Visit: Payer: BC Managed Care – PPO | Attending: Internal Medicine

## 2019-03-14 DIAGNOSIS — Z23 Encounter for immunization: Secondary | ICD-10-CM | POA: Insufficient documentation

## 2019-03-14 NOTE — Progress Notes (Signed)
   Covid-19 Vaccination Clinic  Name:  PRIMUS GRITTON    MRN: [id number] DOB: 04-15-1965  03/14/2019  Mr. Arzuaga was observed post Covid-19 immunization for 15 minutes without incidence. He was provided with Vaccine Information Sheet and instruction to access the V-Safe system.   Mr. Partin was instructed to call 911 with any severe reactions post vaccine: Marland Kitchen Difficulty breathing  . Swelling of your face and throat  . A fast heartbeat  . A bad rash all over your body  . Dizziness and weakness    Immunizations Administered    Name Date Dose VIS Date Route   Pfizer COVID-19 Vaccine 03/14/2019  4:08 PM 0.3 mL 01/10/2019 Intramuscular   Manufacturer: [employer], [employer]   Lot: [id number]   NDC: [address]-2

## 2019-03-16 ENCOUNTER — Ambulatory Visit: Payer: BC Managed Care – PPO

## 2019-04-07 ENCOUNTER — Ambulatory Visit: Payer: BC Managed Care – PPO | Attending: Internal Medicine

## 2019-04-07 DIAGNOSIS — Z23 Encounter for immunization: Secondary | ICD-10-CM | POA: Insufficient documentation

## 2019-04-07 NOTE — Progress Notes (Signed)
   Covid-19 Vaccination Clinic  Name:  Larry Cunningham    MRN: [id number] DOB: Jun 18, 1965  04/07/2019  Mr. Wedge was observed post Covid-19 immunization for 15 minutes without incident. He was provided with Vaccine Information Sheet and instruction to access the V-Safe system.   [name] was instructed to call 911 with any severe reactions post vaccine: Marland Kitchen Difficulty breathing  . Swelling of face and throat  . A fast heartbeat  . A bad rash all over body  . Dizziness and weakness   Immunizations Administered    Name Date Dose VIS Date Route   Pfizer COVID-19 Vaccine 04/07/2019  8:46 AM 0.3 mL 01/10/2019 Intramuscular   Manufacturer: [employer], [employer]   Lot: [id number]   NDC: [address]-5

## 2020-01-15 DIAGNOSIS — Z23 Encounter for immunization: Secondary | ICD-10-CM | POA: Diagnosis not present

## 2020-01-15 DIAGNOSIS — E7800 Pure hypercholesterolemia, unspecified: Secondary | ICD-10-CM | POA: Diagnosis not present

## 2020-01-15 DIAGNOSIS — Z0000 Encounter for general adult medical examination without abnormal findings: Secondary | ICD-10-CM | POA: Diagnosis not present

## 2020-01-15 DIAGNOSIS — Z131 Encounter for screening for diabetes mellitus: Secondary | ICD-10-CM | POA: Diagnosis not present

## 2020-01-15 DIAGNOSIS — Z125 Encounter for screening for malignant neoplasm of prostate: Secondary | ICD-10-CM | POA: Diagnosis not present

## 2021-01-17 DIAGNOSIS — E7800 Pure hypercholesterolemia, unspecified: Secondary | ICD-10-CM | POA: Diagnosis not present

## 2021-01-17 DIAGNOSIS — Z131 Encounter for screening for diabetes mellitus: Secondary | ICD-10-CM | POA: Diagnosis not present

## 2021-01-17 DIAGNOSIS — Z23 Encounter for immunization: Secondary | ICD-10-CM | POA: Diagnosis not present

## 2021-01-17 DIAGNOSIS — Z125 Encounter for screening for malignant neoplasm of prostate: Secondary | ICD-10-CM | POA: Diagnosis not present

## 2021-01-17 DIAGNOSIS — Z0000 Encounter for general adult medical examination without abnormal findings: Secondary | ICD-10-CM | POA: Diagnosis not present

## 2021-01-19 ENCOUNTER — Other Ambulatory Visit (HOSPITAL_BASED_OUTPATIENT_CLINIC_OR_DEPARTMENT_OTHER): Payer: Self-pay | Admitting: Family Medicine

## 2021-01-19 DIAGNOSIS — E7800 Pure hypercholesterolemia, unspecified: Secondary | ICD-10-CM

## 2021-02-16 ENCOUNTER — Other Ambulatory Visit: Payer: Self-pay

## 2021-02-16 ENCOUNTER — Ambulatory Visit (HOSPITAL_BASED_OUTPATIENT_CLINIC_OR_DEPARTMENT_OTHER)
Admission: RE | Admit: 2021-02-16 | Discharge: 2021-02-16 | Disposition: A | Discharge status: Home / Self Care | Payer: BC Managed Care – PPO | Source: Ambulatory Visit | Attending: Family Medicine | Admitting: Family Medicine

## 2021-02-16 ENCOUNTER — Encounter (HOSPITAL_BASED_OUTPATIENT_CLINIC_OR_DEPARTMENT_OTHER): Payer: Self-pay

## 2021-02-16 DIAGNOSIS — E7800 Pure hypercholesterolemia, unspecified: Secondary | ICD-10-CM | POA: Insufficient documentation

## 2021-03-25 NOTE — Progress Notes (Signed)
?  ?Cardiology Office Note ? ? ?Date:  03/25/2021  ? ?ID:  Larry Cunningham, DOB 1965-10-22, MRN [id number] ? ?PCP:  Roderick Pee, MD (Inactive)  ?Cardiologist:   Krystal Teachey [place], MD  ? ?No chief complaint on file. ? ? ?  ?History of Present Illness: ?Larry Cunningham is a 56 y.o. male who is seen at the request of Dr Docia Chuck for evaluation of CAD. He had a recent screening coronary calcium score done. His score was 150 placing him at the 86th percentile. He does have HLD. Recently started on Crestor 5 mg daily. ? ?He tries to eat healthy. Lots of vegetables and lean chicken or fish. Exercises 4-5 days/week running or using elliptical. No history of DM, HTN, smoking or family history of CAD. Denies any chest pain, SOB, palpitations. Feels well.  ? ? ? ?Past Medical History:  ?Diagnosis Date  ? Allergy   ? Low back pain   ? Numbness   ? right leg  ? RLS (restless legs syndrome)   ? ? ?Past Surgical History:  ?Procedure Laterality Date  ? TONSILLECTOMY    ? tubes in ear    ? ? ? ?Current Outpatient Medications  ?Medication Sig Dispense Refill  ? HYDROcodone-homatropine (HYCODAN) 5-1.5 MG/5ML syrup Take 5 mLs by mouth every 8 (eight) hours as needed. 240 mL 0  ? mometasone (NASONEX) 50 MCG/ACT nasal spray Place 2 sprays into the nose 2 (two) times daily. As directed (Patient taking differently: Place 2 sprays into the nose daily as needed. As directed) 17 g 11  ? montelukast (SINGULAIR) 10 MG tablet Take 1 tablet (10 mg total) by mouth daily. (Patient taking differently: Take 10 mg by mouth daily as needed. ) 100 tablet 3  ? ?No current facility-administered medications for this visit.  ? ? ?Allergies:   Patient has no known allergies.  ? ? ?Social History:  The patient  reports that he has never smoked. He does not have any smokeless tobacco history on file. He reports current alcohol use. He reports that he does not use drugs.  ? ?Family History:  The patient's family history includes Allergies in his father; Asthma  in his father; Emphysema in his father.  ? ? ?ROS:  Please see the history of present illness.   Otherwise, review of systems are positive for none.   All other systems are reviewed and negative.  ? ? ?PHYSICAL EXAM: ?VS:  There were no vitals taken for this visit. , BMI There is no height or weight on file to calculate BMI. ?GEN: Well nourished, well developed, in no acute distress ?HEENT: normal ?Neck: no JVD, carotid bruits, or masses ?Cardiac: RRR; no murmurs, rubs, or gallops,no edema  ?Respiratory:  clear to auscultation bilaterally, normal work of breathing ?GI: soft, nontender, nondistended, + BS ?MS: no deformity or atrophy ?Skin: warm and dry, no rash ?Neuro:  Strength and sensation are intact ?Psych: euthymic mood, full affect ? ? ?EKG:  EKG is ordered today. ?The ekg ordered today demonstrates NSR rate 52. Borderline voltage for LVH. I have personally reviewed and interpreted this study. ? ? ? ?Recent Labs: ?No results found for requested labs within last 8760 hours.  ? ? ?Lipid Panel ?   ?Component Value Date/Time  ? CHOL 202 (H) 10/30/2013 0836  ? TRIG 92.0 10/30/2013 0836  ? HDL 60.10 10/30/2013 0836  ? CHOLHDL 3 10/30/2013 0836  ? VLDL 18.4 10/30/2013 0836  ? LDLCALC 124 (H) 10/30/2013 2831  ?  LDLDIRECT 133.8 01/13/2011 1314  ? ?Dated 01/17/21: cholesterol 206, triglycerides 89, HDL 78, LDL 112. A1c 5.7%. CMET normal. ? ?Wt Readings from Last 3 Encounters:  ?04/17/14 205 lb 3.2 oz (93.1 kg)  ?03/23/14 213 lb (96.6 kg)  ?12/30/13 213 lb 3.2 oz (96.7 kg)  ?  ? ? ?Other studies Reviewed: ?Additional studies/ records that were reviewed today include:  ? ?ADDENDUM REPORT: 02/17/2021 09:39 ?  ?EXAM: ?OVER-READ INTERPRETATION  CT CHEST ?  ?The following report is an over-read performed by radiologist Dr. ?[facility], [place] on 02/17/2021. This ?over-read does not include interpretation of cardiac or coronary ?anatomy or pathology. The coronary calcium score interpretation by ?the  cardiologist is attached. ?  ?COMPARISON:  None. ?  ?FINDINGS: ?Top-normal caliber of the central pulmonary arteries with the main ?pulmonary artery measuring up to approximately 3.2 cm. Component of ?underlying pulmonary hypertension cannot be excluded. Visualized ?mediastinum and hilar regions demonstrate no lymphadenopathy or ?masses. Visualized lungs show no evidence of pulmonary edema, ?consolidation, pneumothorax, nodule or pleural fluid. Visualized ?upper abdomen and bony structures are unremarkable. ?  ?IMPRESSION: ?Top-normal/mildly dilated central pulmonary arteries may be ?consistent with some degree of underlying pulmonary hypertension. ?This is a somewhat nonspecific finding by CT and correlation with ?echocardiography may be helpful. ?  ?  ?Electronically Signed ?  By: Irish Lack M.D. ?  On: 02/17/2021 09:39 ?   ?Addended by Irish Lack, MD on 02/17/2021  9:41 AM  ? ?Study Result ? ?Narrative & Impression  ?: ?Cardiovascular Disease Risk stratification ?  ?EXAM: ?Coronary Calcium Score ?  ?TECHNIQUE: ?A gated, non-contrast computed tomography scan of the heart was ?performed using 66mm slice thickness. Axial images were analyzed on a ?dedicated workstation. Calcium scoring of the coronary arteries was ?performed using the Agatston method. ?  ?FINDINGS: ?Coronary arteries: Normal origins. ?  ?Coronary Calcium Score: ?  ?Left main: 0 ?  ?Left anterior descending artery: 126 ?  ?Left circumflex artery: 24.1 ?  ?Right coronary artery: 0 ?  ?Total: 150 ?  ?Percentile: 86 ?  ?Pericardium: Normal. ?  ?Ascending Aorta: Normal caliber. ?  ?Non-cardiac: See separate report from [facility]. ?  ?IMPRESSION: ?Coronary calcium score of 150. This was 55 percentile for age-, ?race-, and sex-matched controls. ?   ? ? ?ASSESSMENT AND PLAN: ? ?1.  Coronary calcification noted on CT with score of 150. He is asymptomatic. Recommend heart healthy diet and regular aerobic exercise as he is doing. Agree with  starting statin therapy. Ideally goal LDL < 70. At this point no further CV testing needed unless he develops symptoms. ?2. HLD now on statin. Follow up lab in 3 months with Dr Docia Chuck.  ? ? ?Current medicines are reviewed at length with the patient today.  The patient does not have concerns regarding medicines. ? ?The following changes have been made:  no change ? ?Labs/ tests ordered today include:  ?No orders of the defined types were placed in this encounter. ? ? ?   ? ? ?Disposition:   FU with me PRN ? ?Signed, ?Yordi Krager [place], MD  ?03/25/2021 7:47 AM    ?[facility] ?[address] [place], [place], [address] ?Phone [phone], Fax [phone] ? ? ?

## 2021-03-31 ENCOUNTER — Ambulatory Visit (INDEPENDENT_AMBULATORY_CARE_PROVIDER_SITE_OTHER): Payer: BC Managed Care – PPO | Admitting: Cardiology

## 2021-03-31 ENCOUNTER — Encounter: Payer: Self-pay | Admitting: Cardiology

## 2021-03-31 ENCOUNTER — Other Ambulatory Visit: Payer: Self-pay

## 2021-03-31 VITALS — BP 122/80 | HR 52 | Resp 20 | Ht 71.0 in | Wt 197.2 lb

## 2021-03-31 DIAGNOSIS — I10 Essential (primary) hypertension: Secondary | ICD-10-CM

## 2021-03-31 DIAGNOSIS — I2584 Coronary atherosclerosis due to calcified coronary lesion: Secondary | ICD-10-CM

## 2021-03-31 DIAGNOSIS — I2510 Atherosclerotic heart disease of native coronary artery without angina pectoris: Secondary | ICD-10-CM

## 2022-01-05 DIAGNOSIS — E7800 Pure hypercholesterolemia, unspecified: Secondary | ICD-10-CM | POA: Diagnosis not present

## 2022-01-05 DIAGNOSIS — Z131 Encounter for screening for diabetes mellitus: Secondary | ICD-10-CM | POA: Diagnosis not present

## 2022-01-05 DIAGNOSIS — Z0000 Encounter for general adult medical examination without abnormal findings: Secondary | ICD-10-CM | POA: Diagnosis not present

## 2022-01-05 DIAGNOSIS — Z125 Encounter for screening for malignant neoplasm of prostate: Secondary | ICD-10-CM | POA: Diagnosis not present

## 2022-03-27 ENCOUNTER — Encounter: Payer: Self-pay | Admitting: Emergency Medicine

## 2022-03-27 ENCOUNTER — Ambulatory Visit (INDEPENDENT_AMBULATORY_CARE_PROVIDER_SITE_OTHER): Payer: BC Managed Care – PPO | Admitting: Emergency Medicine

## 2022-03-27 VITALS — BP 136/84 | HR 48 | Temp 98.1°F | Ht 71.0 in | Wt 196.4 lb

## 2022-03-27 DIAGNOSIS — Z7689 Persons encountering health services in other specified circumstances: Secondary | ICD-10-CM | POA: Diagnosis not present

## 2022-03-27 DIAGNOSIS — E785 Hyperlipidemia, unspecified: Secondary | ICD-10-CM

## 2022-03-27 NOTE — Progress Notes (Signed)
Larry Cunningham 57 y.o.   Chief Complaint  Patient presents with   New Patient (Initial Visit)    No concerns     HISTORY OF PRESENT ILLNESS: This is a 57 y.o. male first visit to this office, here to establish care with me. History of dyslipidemia on rosuvastatin 5 mg daily. Otherwise healthy male with a healthy lifestyle. Non-smoker.  Exercises regularly. Healthy diet. No other complaints or medical concerns today.  HPI   Prior to Admission medications   Medication Sig Start Date End Date Taking? Authorizing Provider  mometasone (NASONEX) 50 MCG/ACT nasal spray Place 2 sprays into the nose 2 (two) times daily. As directed Patient taking differently: Place 2 sprays into the nose daily as needed. As directed 11/10/13  Yes Dorena Cookey, MD  rosuvastatin (CRESTOR) 5 MG tablet Take 5 mg by mouth daily.   Yes [provider]  montelukast (SINGULAIR) 10 MG tablet Take 1 tablet (10 mg total) by mouth daily. Patient taking differently: Take 10 mg by mouth daily as needed.  11/10/13 09/08/17  Dorena Cookey, MD    No Known Allergies  Patient Active Problem List   Diagnosis Date Noted   OSA (obstructive sleep apnea) 11/10/2013   Decreased libido 01/13/2011   RESTLESS LEG SYNDROME 02/08/2007    Past Medical History:  Diagnosis Date   Allergy    Low back pain    Numbness    right leg   RLS (restless legs syndrome)     Past Surgical History:  Procedure Laterality Date   TONSILLECTOMY     tubes in ear      Social History   Socioeconomic History   Marital status: Married    Spouse name: Not on file   Number of children: 2   Years of education: Not on file   Highest education level: Not on file  Occupational History   Not on file  Tobacco Use   Smoking status: Never   Smokeless tobacco: Not on file  Substance and Sexual Activity   Alcohol use: Yes    Alcohol/week: 0.0 standard drinks of alcohol    Comment: 3-4 times per week   Drug use: No    Sexual activity: Not on file  Other Topics Concern   Not on file  Social History Narrative   Not on file   Social Determinants of Health   Financial Resource Strain: Not on file  Food Insecurity: Not on file  Transportation Needs: Not on file  Physical Activity: Not on file  Stress: Not on file  Social Connections: Not on file  Intimate Partner Violence: Not on file    Family History  Problem Relation Age of Onset   Pulmonary Hypertension Mother    Emphysema Father    Asthma Father    Allergies Father      Review of Systems  Constitutional: Negative.  Negative for chills and fever.  HENT: Negative.  Negative for congestion and sore throat.   Respiratory: Negative.  Negative for cough and shortness of breath.   Cardiovascular: Negative.  Negative for chest pain and palpitations.  Gastrointestinal:  Negative for abdominal pain, diarrhea, nausea and vomiting.  Genitourinary: Negative.  Negative for dysuria and hematuria.  Skin: Negative.  Negative for rash.  Neurological: Negative.  Negative for dizziness and headaches.  All other systems reviewed and are negative.  Today's Vitals   03/27/22 0906  BP: 136/84  Pulse: (!) 48  Temp: 98.1 F (36.7 C)  TempSrc:  Oral  SpO2: 98%  Weight: 196 lb 6 oz (89.1 kg)  Height: '5\' 11"'$  (1.803 m)   Body mass index is 27.39 kg/m.   Physical Exam Vitals reviewed.  Constitutional:      Appearance: Normal appearance.  HENT:     Head: Normocephalic.     Mouth/Throat:     Mouth: Mucous membranes are moist.     Pharynx: Oropharynx is clear.  Eyes:     Extraocular Movements: Extraocular movements intact.     Pupils: Pupils are equal, round, and reactive to light.  Cardiovascular:     Rate and Rhythm: Normal rate and regular rhythm.     Pulses: Normal pulses.     Heart sounds: Normal heart sounds.  Pulmonary:     Effort: Pulmonary effort is normal.     Breath sounds: Normal breath sounds.  Musculoskeletal:     Cervical back:  No tenderness.  Lymphadenopathy:     Cervical: No cervical adenopathy.  Skin:    General: Skin is warm and dry.  Neurological:     General: No focal deficit present.     Mental Status: He is alert and oriented to person, place, and time.  Psychiatric:        Mood and Affect: Mood normal.        Behavior: Behavior normal.      ASSESSMENT & PLAN: A total of 47 minutes was spent with the patient and counseling/coordination of care regarding preparing for this visit, review of available medical records, establishing care with me, comprehensive history and physical exam, diagnosis of dyslipidemia and cardiovascular risks associated with this condition, education on nutrition, prognosis, documentation and need for follow-up.  Problem List Items Addressed This Visit       Other   Dyslipidemia - Primary    Chronic stable condition. Cardiovascular risks associated with dyslipidemia discussed. Diet and nutrition discussed Recommend to continue rosuvastatin 5 mg daily. Blood work at next annual exam office visit this year.      Other Visit Diagnoses     Encounter to establish care          Patient Instructions  Health Maintenance, Male Adopting a healthy lifestyle and getting preventive care are important in promoting health and wellness. Ask your health care provider about: The right schedule for you to have regular tests and exams. Things you can do on your own to prevent diseases and keep yourself healthy. What should I know about diet, weight, and exercise? Eat a healthy diet  Eat a diet that includes plenty of vegetables, fruits, low-fat dairy products, and lean protein. Do not eat a lot of foods that are high in solid fats, added sugars, or sodium. Maintain a healthy weight Body mass index (BMI) is a measurement that can be used to identify possible weight problems. It estimates body fat based on height and weight. Your health care provider can help determine your BMI and  help you achieve or maintain a healthy weight. Get regular exercise Get regular exercise. This is one of the most important things you can do for your health. Most adults should: Exercise for at least 150 minutes each week. The exercise should increase your heart rate and make you sweat (moderate-intensity exercise). Do strengthening exercises at least twice a week. This is in addition to the moderate-intensity exercise. Spend less time sitting. Even light physical activity can be beneficial. Watch cholesterol and blood lipids Have your blood tested for lipids and cholesterol at 56 years of  age, then have this test every 5 years. You may need to have your cholesterol levels checked more often if: Your lipid or cholesterol levels are high. You are older than 56 years of age. You are at high risk for heart disease. What should I know about cancer screening? Many types of cancers can be detected early and may often be prevented. Depending on your health history and family history, you may need to have cancer screening at various ages. This may include screening for: Colorectal cancer. Prostate cancer. Skin cancer. Lung cancer. What should I know about heart disease, diabetes, and high blood pressure? Blood pressure and heart disease High blood pressure causes heart disease and increases the risk of stroke. This is more likely to develop in people who have high blood pressure readings or are overweight. Talk with your health care provider about your target blood pressure readings. Have your blood pressure checked: Every 3-5 years if you are 15-52 years of age. Every year if you are 53 years old or older. If you are between the ages of 66 and 47 and are a current or former smoker, ask your health care provider if you should have a one-time screening for abdominal aortic aneurysm (AAA). Diabetes Have regular diabetes screenings. This checks your fasting blood sugar level. Have the screening  done: Once every three years after age 40 if you are at a normal weight and have a low risk for diabetes. More often and at a younger age if you are overweight or have a high risk for diabetes. What should I know about preventing infection? Hepatitis B If you have a higher risk for hepatitis B, you should be screened for this virus. Talk with your health care provider to find out if you are at risk for hepatitis B infection. Hepatitis C Blood testing is recommended for: Everyone born from [birth year] through 1964. Anyone with known risk factors for hepatitis C. Sexually transmitted infections (STIs) You should be screened each year for STIs, including gonorrhea and chlamydia, if: You are sexually active and are younger than 56 years of age. You are older than 56 years of age and your health care provider tells you that you are at risk for this type of infection. Your sexual activity has changed since you were last screened, and you are at increased risk for chlamydia or gonorrhea. Ask your health care provider if you are at risk. Ask your health care provider about whether you are at high risk for HIV. Your health care provider may recommend a prescription medicine to help prevent HIV infection. If you choose to take medicine to prevent HIV, you should first get tested for HIV. You should then be tested every 3 months for as long as you are taking the medicine. Follow these instructions at home: Alcohol use Do not drink alcohol if your health care provider tells you not to drink. If you drink alcohol: Limit how much you have to 0-2 drinks a day. Know how much alcohol is in your drink. In the [place] one drink equals one 12 oz bottle of beer (355 mL), one 5 oz glass of wine (148 mL), or one 1 oz glass of hard liquor (44 mL). Lifestyle Do not use any products that contain nicotine or tobacco. These products include cigarettes, chewing tobacco, and vaping devices, such as e-cigarettes. If you need help  quitting, ask your health care provider. Do not use street drugs. Do not share needles. Ask your health care provider for  help if you need support or information about quitting drugs. General instructions Schedule regular health, dental, and eye exams. Stay current with your vaccines. Tell your health care provider if: You often feel depressed. You have ever been abused or do not feel safe at home. Summary Adopting a healthy lifestyle and getting preventive care are important in promoting health and wellness. Follow your health care provider's instructions about healthy diet, exercising, and getting tested or screened for diseases. Follow your health care provider's instructions on monitoring your cholesterol and blood pressure. This information is not intended to replace advice given to you by your health care provider. Make sure you discuss any questions you have with your health care provider. Document Revised: 06/07/2020 Document Reviewed: 06/07/2020 Elsevier Patient Education  [place], MD [facility]

## 2022-03-27 NOTE — Patient Instructions (Signed)
Health Maintenance, Male Adopting a healthy lifestyle and getting preventive care are important in promoting health and wellness. Ask your health care provider about: The right schedule for you to have regular tests and exams. Things you can do on your own to prevent diseases and keep yourself healthy. What should I know about diet, weight, and exercise? Eat a healthy diet  Eat a diet that includes plenty of vegetables, fruits, low-fat dairy products, and lean protein. Do not eat a lot of foods that are high in solid fats, added sugars, or sodium. Maintain a healthy weight Body mass index (BMI) is a measurement that can be used to identify possible weight problems. It estimates body fat based on height and weight. Your health care provider can help determine your BMI and help you achieve or maintain a healthy weight. Get regular exercise Get regular exercise. This is one of the most important things you can do for your health. Most adults should: Exercise for at least 150 minutes each week. The exercise should increase your heart rate and make you sweat (moderate-intensity exercise). Do strengthening exercises at least twice a week. This is in addition to the moderate-intensity exercise. Spend less time sitting. Even light physical activity can be beneficial. Watch cholesterol and blood lipids Have your blood tested for lipids and cholesterol at 56 years of age, then have this test every 5 years. You may need to have your cholesterol levels checked more often if: Your lipid or cholesterol levels are high. You are older than 56 years of age. You are at high risk for heart disease. What should I know about cancer screening? Many types of cancers can be detected early and may often be prevented. Depending on your health history and family history, you may need to have cancer screening at various ages. This may include screening for: Colorectal cancer. Prostate cancer. Skin cancer. Lung  cancer. What should I know about heart disease, diabetes, and high blood pressure? Blood pressure and heart disease High blood pressure causes heart disease and increases the risk of stroke. This is more likely to develop in people who have high blood pressure readings or are overweight. Talk with your health care provider about your target blood pressure readings. Have your blood pressure checked: Every 3-5 years if you are 18-39 years of age. Every year if you are 40 years old or older. If you are between the ages of 65 and 75 and are a current or former smoker, ask your health care provider if you should have a one-time screening for abdominal aortic aneurysm (AAA). Diabetes Have regular diabetes screenings. This checks your fasting blood sugar level. Have the screening done: Once every three years after age 45 if you are at a normal weight and have a low risk for diabetes. More often and at a younger age if you are overweight or have a high risk for diabetes. What should I know about preventing infection? Hepatitis B If you have a higher risk for hepatitis B, you should be screened for this virus. Talk with your health care provider to find out if you are at risk for hepatitis B infection. Hepatitis C Blood testing is recommended for: Everyone born from 1945 through 1965. Anyone with known risk factors for hepatitis C. Sexually transmitted infections (STIs) You should be screened each year for STIs, including gonorrhea and chlamydia, if: You are sexually active and are younger than 56 years of age. You are older than 56 years of age and your   health care provider tells you that you are at risk for this type of infection. Your sexual activity has changed since you were last screened, and you are at increased risk for chlamydia or gonorrhea. Ask your health care provider if you are at risk. Ask your health care provider about whether you are at high risk for HIV. Your health care provider  may recommend a prescription medicine to help prevent HIV infection. If you choose to take medicine to prevent HIV, you should first get tested for HIV. You should then be tested every 3 months for as long as you are taking the medicine. Follow these instructions at home: Alcohol use Do not drink alcohol if your health care provider tells you not to drink. If you drink alcohol: Limit how much you have to 0-2 drinks a day. Know how much alcohol is in your drink. In the [place] one drink equals one 12 oz bottle of beer (355 mL), one 5 oz glass of wine (148 mL), or one 1 oz glass of hard liquor (44 mL). Lifestyle Do not use any products that contain nicotine or tobacco. These products include cigarettes, chewing tobacco, and vaping devices, such as e-cigarettes. If you need help quitting, ask your health care provider. Do not use street drugs. Do not share needles. Ask your health care provider for help if you need support or information about quitting drugs. General instructions Schedule regular health, dental, and eye exams. Stay current with your vaccines. Tell your health care provider if: You often feel depressed. You have ever been abused or do not feel safe at home. Summary Adopting a healthy lifestyle and getting preventive care are important in promoting health and wellness. Follow your health care provider's instructions about healthy diet, exercising, and getting tested or screened for diseases. Follow your health care provider's instructions on monitoring your cholesterol and blood pressure. This information is not intended to replace advice given to you by your health care provider. Make sure you discuss any questions you have with your health care provider. Document Revised: 06/07/2020 Document Reviewed: 06/07/2020 Elsevier Patient Education  2023 Elsevier Inc.  

## 2022-03-27 NOTE — Assessment & Plan Note (Signed)
Chronic stable condition. Cardiovascular risks associated with dyslipidemia discussed. Diet and nutrition discussed Recommend to continue rosuvastatin 5 mg daily. Blood work at next annual exam office visit this year.

## 2022-12-20 ENCOUNTER — Ambulatory Visit (INDEPENDENT_AMBULATORY_CARE_PROVIDER_SITE_OTHER): Payer: BC Managed Care – PPO | Admitting: Emergency Medicine

## 2022-12-20 ENCOUNTER — Encounter: Payer: Self-pay | Admitting: Radiology

## 2022-12-20 ENCOUNTER — Encounter: Payer: Self-pay | Admitting: Emergency Medicine

## 2022-12-20 ENCOUNTER — Telehealth: Payer: Self-pay | Admitting: Emergency Medicine

## 2022-12-20 VITALS — BP 118/82 | HR 60 | Temp 98.1°F | Ht 71.0 in | Wt 192.5 lb

## 2022-12-20 DIAGNOSIS — Z13228 Encounter for screening for other metabolic disorders: Secondary | ICD-10-CM | POA: Diagnosis not present

## 2022-12-20 DIAGNOSIS — Z130 Encounter for screening for diseases of the blood and blood-forming organs and certain disorders involving the immune mechanism: Secondary | ICD-10-CM | POA: Diagnosis not present

## 2022-12-20 DIAGNOSIS — Z0000 Encounter for general adult medical examination without abnormal findings: Secondary | ICD-10-CM

## 2022-12-20 DIAGNOSIS — E785 Hyperlipidemia, unspecified: Secondary | ICD-10-CM | POA: Diagnosis not present

## 2022-12-20 DIAGNOSIS — Z23 Encounter for immunization: Secondary | ICD-10-CM | POA: Diagnosis not present

## 2022-12-20 DIAGNOSIS — Z125 Encounter for screening for malignant neoplasm of prostate: Secondary | ICD-10-CM | POA: Diagnosis not present

## 2022-12-20 DIAGNOSIS — Z1329 Encounter for screening for other suspected endocrine disorder: Secondary | ICD-10-CM

## 2022-12-20 DIAGNOSIS — G4733 Obstructive sleep apnea (adult) (pediatric): Secondary | ICD-10-CM | POA: Diagnosis not present

## 2022-12-20 DIAGNOSIS — R29818 Other symptoms and signs involving the nervous system: Secondary | ICD-10-CM

## 2022-12-20 LAB — COMPREHENSIVE METABOLIC PANEL
ALT: 17 U/L (ref 0–53)
AST: 24 U/L (ref 0–37)
Albumin: 4.6 g/dL (ref 3.5–5.2)
Alkaline Phosphatase: 42 U/L (ref 39–117)
BUN: 21 mg/dL (ref 6–23)
CO2: 30 meq/L (ref 19–32)
Calcium: 9.4 mg/dL (ref 8.4–10.5)
Chloride: 101 meq/L (ref 96–112)
Creatinine, Ser: 1.11 mg/dL (ref 0.40–1.50)
GFR: 74.34 mL/min (ref >60.00)
Glucose, Bld: 94 mg/dL (ref 70–99)
Potassium: 4.6 meq/L (ref 3.5–5.1)
Sodium: 141 meq/L (ref 135–145)
Total Bilirubin: 1 mg/dL (ref 0.2–1.2)
Total Protein: 7 g/dL (ref 6.0–8.3)

## 2022-12-20 LAB — CBC WITH DIFFERENTIAL/PLATELET
Basophils Absolute: 0 10*3/uL (ref 0.0–0.1)
Basophils Relative: 0.3 % (ref 0.0–3.0)
Eosinophils Absolute: 0.1 10*3/uL (ref 0.0–0.7)
Eosinophils Relative: 0.8 % (ref 0.0–5.0)
HCT: 41.9 % (ref 39.0–52.0)
Hemoglobin: 14.1 g/dL (ref 13.0–17.0)
Lymphocytes Relative: 15.3 % (ref 12.0–46.0)
Lymphs Abs: 1.1 10*3/uL (ref 0.7–4.0)
MCHC: 33.7 g/dL (ref 30.0–36.0)
MCV: 90.6 fL (ref 78.0–100.0)
Monocytes Absolute: 0.4 10*3/uL (ref 0.1–1.0)
Monocytes Relative: 5.6 % (ref 3.0–12.0)
Neutro Abs: 5.5 10*3/uL (ref 1.4–7.7)
Neutrophils Relative %: 78 % — ABNORMAL HIGH (ref 43.0–77.0)
Platelets: 283 10*3/uL (ref 150.0–400.0)
RBC: 4.62 Mil/uL (ref 4.22–5.81)
RDW: 13.4 % (ref 11.5–15.5)
WBC: 7 10*3/uL (ref 4.0–10.5)

## 2022-12-20 LAB — LIPID PANEL
Cholesterol: 181 mg/dL (ref 0–200)
HDL: 88 mg/dL (ref >39.00)
LDL Cholesterol: 73 mg/dL (ref 0–99)
NonHDL: 93.19
Total CHOL/HDL Ratio: 2
Triglycerides: 103 mg/dL (ref 0.0–149.0)
VLDL: 20.6 mg/dL (ref 0.0–40.0)

## 2022-12-20 LAB — PSA: PSA: 1.17 ng/mL (ref 0.10–4.00)

## 2022-12-20 LAB — TSH: TSH: 2.3 u[IU]/mL (ref 0.35–5.50)

## 2022-12-20 NOTE — Progress Notes (Signed)
Larry Cunningham 57 y.o.   Chief complaint: Here for physical exam  HISTORY OF PRESENT ILLNESS: This is a 57 y.o. male here for physical. Doing very well.  Has no complaints or medical concerns today History of dyslipidemia on rosuvastatin   HPI   Prior to Admission medications   Medication Sig Start Date End Date Taking? Authorizing Provider  mometasone (NASONEX) 50 MCG/ACT nasal spray Place 2 sprays into the nose 2 (two) times daily. As directed Patient taking differently: Place 2 sprays into the nose daily as needed. As directed 11/10/13  Yes Roderick Pee, MD  rosuvastatin (CRESTOR) 5 MG tablet Take 5 mg by mouth daily.   Yes [provider]  montelukast (SINGULAIR) 10 MG tablet Take 1 tablet (10 mg total) by mouth daily. Patient taking differently: Take 10 mg by mouth daily as needed.  11/10/13 09/08/17  Roderick Pee, MD    No Known Allergies  Patient Active Problem List   Diagnosis Date Noted   Dyslipidemia 03/27/2022   OSA (obstructive sleep apnea) 11/10/2013   Decreased libido 01/13/2011   RESTLESS LEG SYNDROME 02/08/2007    Past Medical History:  Diagnosis Date   Allergy    Low back pain    Numbness    right leg   RLS (restless legs syndrome)     Past Surgical History:  Procedure Laterality Date   TONSILLECTOMY     tubes in ear      Social History   Socioeconomic History   Marital status: Married    Spouse name: Not on file   Number of children: 2   Years of education: Not on file   Highest education level: Not on file  Occupational History   Not on file  Tobacco Use   Smoking status: Never   Smokeless tobacco: Not on file  Substance and Sexual Activity   Alcohol use: Yes    Alcohol/week: 0.0 standard drinks of alcohol    Comment: 3-4 times per week   Drug use: No   Sexual activity: Not on file  Other Topics Concern   Not on file  Social History Narrative   Not on file   Social Determinants of Health   Financial  Resource Strain: Not on file  Food Insecurity: Not on file  Transportation Needs: Not on file  Physical Activity: Not on file  Stress: Not on file  Social Connections: Not on file  Intimate Partner Violence: Not on file    Family History  Problem Relation Age of Onset   Pulmonary Hypertension Mother    Emphysema Father    Asthma Father    Allergies Father      Review of Systems  Constitutional: Negative.  Negative for chills and fever.  HENT: Negative.  Negative for congestion and sore throat.   Respiratory: Negative.  Negative for cough and shortness of breath.   Cardiovascular: Negative.  Negative for chest pain and palpitations.  Gastrointestinal: Negative.  Negative for abdominal pain, constipation, diarrhea, nausea and vomiting.  Genitourinary: Negative.  Negative for dysuria and hematuria.  Skin: Negative.  Negative for rash.  Neurological: Negative.  Negative for dizziness and headaches.  All other systems reviewed and are negative.   Vitals:   12/20/22 0847  BP: 118/82  Pulse: 60  Temp: 98.1 F (36.7 C)  SpO2: 97%    Physical Exam Vitals reviewed.  Constitutional:      Appearance: Normal appearance.  HENT:     Head: Normocephalic.  Right Ear: Tympanic membrane, ear canal and external ear normal.     Left Ear: Tympanic membrane, ear canal and external ear normal.     Mouth/Throat:     Mouth: Mucous membranes are moist.     Pharynx: Oropharynx is clear.  Eyes:     Extraocular Movements: Extraocular movements intact.     Conjunctiva/sclera: Conjunctivae normal.     Pupils: Pupils are equal, round, and reactive to light.  Cardiovascular:     Rate and Rhythm: Normal rate and regular rhythm.     Pulses: Normal pulses.     Heart sounds: Normal heart sounds.  Pulmonary:     Effort: Pulmonary effort is normal.     Breath sounds: Normal breath sounds.  Abdominal:     Palpations: Abdomen is soft.     Tenderness: There is no abdominal tenderness.   Musculoskeletal:     Cervical back: No tenderness.  Lymphadenopathy:     Cervical: No cervical adenopathy.  Skin:    General: Skin is warm and dry.     Capillary Refill: Capillary refill takes less than 2 seconds.  Neurological:     General: No focal deficit present.     Mental Status: He is alert and oriented to person, place, and time.  Psychiatric:        Mood and Affect: Mood normal.        Behavior: Behavior normal.      ASSESSMENT & PLAN: Problem List Items Addressed This Visit       Respiratory   OSA (obstructive sleep apnea)     Other   Dyslipidemia   Relevant Orders   Lipid panel   Comprehensive metabolic panel   CBC with Differential/Platelet   Other Visit Diagnoses     Routine general medical examination at a health care facility    -  Primary   Relevant Orders   Lipid panel   Comprehensive metabolic panel   CBC with Differential/Platelet   TSH   PSA   Need for vaccination       Relevant Orders   Flu vaccine trivalent PF, 6mos and older(Flulaval,Afluria,Fluarix,Fluzone)   Screening for deficiency anemia       Relevant Orders   CBC with Differential/Platelet   Screening for endocrine, metabolic and immunity disorder       Relevant Orders   Comprehensive metabolic panel   TSH   Screening for prostate cancer       Relevant Orders   PSA      Modifiable risk factors discussed with patient. Anticipatory guidance according to age provided. The following topics were also discussed: Social Determinants of Health Smoking.  Non-smoker Diet and nutrition Benefits of exercise Cancer screening and review of most recent colonoscopy report from 2019 Vaccinations reviewed and recommendations Cardiovascular risk assessment Review of dyslipidemia management Review of medications Mental health including depression and anxiety Fall and accident prevention  Patient Instructions  Health Maintenance, Male Adopting a healthy lifestyle and getting  preventive care are important in promoting health and wellness. Ask your health care provider about: The right schedule for you to have regular tests and exams. Things you can do on your own to prevent diseases and keep yourself healthy. What should I know about diet, weight, and exercise? Eat a healthy diet  Eat a diet that includes plenty of vegetables, fruits, low-fat dairy products, and lean protein. Do not eat a lot of foods that are high in solid fats, added sugars, or sodium. Maintain a  healthy weight Body mass index (BMI) is a measurement that can be used to identify possible weight problems. It estimates body fat based on height and weight. Your health care provider can help determine your BMI and help you achieve or maintain a healthy weight. Get regular exercise Get regular exercise. This is one of the most important things you can do for your health. Most adults should: Exercise for at least 150 minutes each week. The exercise should increase your heart rate and make you sweat (moderate-intensity exercise). Do strengthening exercises at least twice a week. This is in addition to the moderate-intensity exercise. Spend less time sitting. Even light physical activity can be beneficial. Watch cholesterol and blood lipids Have your blood tested for lipids and cholesterol at 56 years of age, then have this test every 5 years. You may need to have your cholesterol levels checked more often if: Your lipid or cholesterol levels are high. You are older than 56 years of age. You are at high risk for heart disease. What should I know about cancer screening? Many types of cancers can be detected early and may often be prevented. Depending on your health history and family history, you may need to have cancer screening at various ages. This may include screening for: Colorectal cancer. Prostate cancer. Skin cancer. Lung cancer. What should I know about heart disease, diabetes, and high blood  pressure? Blood pressure and heart disease High blood pressure causes heart disease and increases the risk of stroke. This is more likely to develop in people who have high blood pressure readings or are overweight. Talk with your health care provider about your target blood pressure readings. Have your blood pressure checked: Every 3-5 years if you are 77-70 years of age. Every year if you are 2 years old or older. If you are between the ages of 66 and 78 and are a current or former smoker, ask your health care provider if you should have a one-time screening for abdominal aortic aneurysm (AAA). Diabetes Have regular diabetes screenings. This checks your fasting blood sugar level. Have the screening done: Once every three years after age 44 if you are at a normal weight and have a low risk for diabetes. More often and at a younger age if you are overweight or have a high risk for diabetes. What should I know about preventing infection? Hepatitis B If you have a higher risk for hepatitis B, you should be screened for this virus. Talk with your health care provider to find out if you are at risk for hepatitis B infection. Hepatitis C Blood testing is recommended for: Everyone born from [birth year] through 1965. Anyone with known risk factors for hepatitis C. Sexually transmitted infections (STIs) You should be screened each year for STIs, including gonorrhea and chlamydia, if: You are sexually active and are younger than 56 years of age. You are older than 56 years of age and your health care provider tells you that you are at risk for this type of infection. Your sexual activity has changed since you were last screened, and you are at increased risk for chlamydia or gonorrhea. Ask your health care provider if you are at risk. Ask your health care provider about whether you are at high risk for HIV. Your health care provider may recommend a prescription medicine to help prevent HIV infection. If you  choose to take medicine to prevent HIV, you should first get tested for HIV. You should then be tested every 3  months for as long as you are taking the medicine. Follow these instructions at home: Alcohol use Do not drink alcohol if your health care provider tells you not to drink. If you drink alcohol: Limit how much you have to 0-2 drinks a day. Know how much alcohol is in your drink. In the [place] one drink equals one 12 oz bottle of beer (355 mL), one 5 oz glass of wine (148 mL), or one 1 oz glass of hard liquor (44 mL). Lifestyle Do not use any products that contain nicotine or tobacco. These products include cigarettes, chewing tobacco, and vaping devices, such as e-cigarettes. If you need help quitting, ask your health care provider. Do not use street drugs. Do not share needles. Ask your health care provider for help if you need support or information about quitting drugs. General instructions Schedule regular health, dental, and eye exams. Stay current with your vaccines. Tell your health care provider if: You often feel depressed. You have ever been abused or do not feel safe at home. Summary Adopting a healthy lifestyle and getting preventive care are important in promoting health and wellness. Follow your health care provider's instructions about healthy diet, exercising, and getting tested or screened for diseases. Follow your health care provider's instructions on monitoring your cholesterol and blood pressure. This information is not intended to replace advice given to you by your health care provider. Make sure you discuss any questions you have with your health care provider. Document Revised: 06/07/2020 Document Reviewed: 06/07/2020 Elsevier Patient Education  2024 Elsevier Inc.      Edwina Barth, MD [facility]

## 2022-12-20 NOTE — Telephone Encounter (Signed)
Refer for sleep studies.  Diagnosis: Suspected sleep apnea.  Thanks.

## 2022-12-20 NOTE — Telephone Encounter (Signed)
Patient saw Dr. Alvy Bimler today and said he forgot to ask a question. He said he was having excessive snoring and would like to know what Dr. Alvy Bimler would advise. Patient would like a call back at [phone].

## 2022-12-20 NOTE — Patient Instructions (Signed)
Health Maintenance, Male Adopting a healthy lifestyle and getting preventive care are important in promoting health and wellness. Ask your health care provider about: The right schedule for you to have regular tests and exams. Things you can do on your own to prevent diseases and keep yourself healthy. What should I know about diet, weight, and exercise? Eat a healthy diet  Eat a diet that includes plenty of vegetables, fruits, low-fat dairy products, and lean protein. Do not eat a lot of foods that are high in solid fats, added sugars, or sodium. Maintain a healthy weight Body mass index (BMI) is a measurement that can be used to identify possible weight problems. It estimates body fat based on height and weight. Your health care provider can help determine your BMI and help you achieve or maintain a healthy weight. Get regular exercise Get regular exercise. This is one of the most important things you can do for your health. Most adults should: Exercise for at least 150 minutes each week. The exercise should increase your heart rate and make you sweat (moderate-intensity exercise). Do strengthening exercises at least twice a week. This is in addition to the moderate-intensity exercise. Spend less time sitting. Even light physical activity can be beneficial. Watch cholesterol and blood lipids Have your blood tested for lipids and cholesterol at 56 years of age, then have this test every 5 years. You may need to have your cholesterol levels checked more often if: Your lipid or cholesterol levels are high. You are older than 56 years of age. You are at high risk for heart disease. What should I know about cancer screening? Many types of cancers can be detected early and may often be prevented. Depending on your health history and family history, you may need to have cancer screening at various ages. This may include screening for: Colorectal cancer. Prostate cancer. Skin cancer. Lung  cancer. What should I know about heart disease, diabetes, and high blood pressure? Blood pressure and heart disease High blood pressure causes heart disease and increases the risk of stroke. This is more likely to develop in people who have high blood pressure readings or are overweight. Talk with your health care provider about your target blood pressure readings. Have your blood pressure checked: Every 3-5 years if you are 18-39 years of age. Every year if you are 40 years old or older. If you are between the ages of 65 and 75 and are a current or former smoker, ask your health care provider if you should have a one-time screening for abdominal aortic aneurysm (AAA). Diabetes Have regular diabetes screenings. This checks your fasting blood sugar level. Have the screening done: Once every three years after age 45 if you are at a normal weight and have a low risk for diabetes. More often and at a younger age if you are overweight or have a high risk for diabetes. What should I know about preventing infection? Hepatitis B If you have a higher risk for hepatitis B, you should be screened for this virus. Talk with your health care provider to find out if you are at risk for hepatitis B infection. Hepatitis C Blood testing is recommended for: Everyone born from 1945 through 1965. Anyone with known risk factors for hepatitis C. Sexually transmitted infections (STIs) You should be screened each year for STIs, including gonorrhea and chlamydia, if: You are sexually active and are younger than 56 years of age. You are older than 56 years of age and your   health care provider tells you that you are at risk for this type of infection. Your sexual activity has changed since you were last screened, and you are at increased risk for chlamydia or gonorrhea. Ask your health care provider if you are at risk. Ask your health care provider about whether you are at high risk for HIV. Your health care provider  may recommend a prescription medicine to help prevent HIV infection. If you choose to take medicine to prevent HIV, you should first get tested for HIV. You should then be tested every 3 months for as long as you are taking the medicine. Follow these instructions at home: Alcohol use Do not drink alcohol if your health care provider tells you not to drink. If you drink alcohol: Limit how much you have to 0-2 drinks a day. Know how much alcohol is in your drink. In the [place] one drink equals one 12 oz bottle of beer (355 mL), one 5 oz glass of wine (148 mL), or one 1 oz glass of hard liquor (44 mL). Lifestyle Do not use any products that contain nicotine or tobacco. These products include cigarettes, chewing tobacco, and vaping devices, such as e-cigarettes. If you need help quitting, ask your health care provider. Do not use street drugs. Do not share needles. Ask your health care provider for help if you need support or information about quitting drugs. General instructions Schedule regular health, dental, and eye exams. Stay current with your vaccines. Tell your health care provider if: You often feel depressed. You have ever been abused or do not feel safe at home. Summary Adopting a healthy lifestyle and getting preventive care are important in promoting health and wellness. Follow your health care provider's instructions about healthy diet, exercising, and getting tested or screened for diseases. Follow your health care provider's instructions on monitoring your cholesterol and blood pressure. This information is not intended to replace advice given to you by your health care provider. Make sure you discuss any questions you have with your health care provider. Document Revised: 06/07/2020 Document Reviewed: 06/07/2020 Elsevier Patient Education  2024 [employer] Inc.  

## 2023-02-05 ENCOUNTER — Encounter: Payer: Self-pay | Admitting: Neurology

## 2023-02-05 ENCOUNTER — Institutional Professional Consult (permissible substitution): Payer: BC Managed Care – PPO | Admitting: Neurology

## 2023-03-01 DIAGNOSIS — H5213 Myopia, bilateral: Secondary | ICD-10-CM | POA: Diagnosis not present

## 2023-03-01 DIAGNOSIS — H0288A Meibomian gland dysfunction right eye, upper and lower eyelids: Secondary | ICD-10-CM | POA: Diagnosis not present

## 2023-03-01 DIAGNOSIS — H0288B Meibomian gland dysfunction left eye, upper and lower eyelids: Secondary | ICD-10-CM | POA: Diagnosis not present

## 2023-03-01 DIAGNOSIS — H524 Presbyopia: Secondary | ICD-10-CM | POA: Diagnosis not present

## 2023-05-02 ENCOUNTER — Encounter: Payer: Self-pay | Admitting: Emergency Medicine

## 2023-05-02 ENCOUNTER — Other Ambulatory Visit: Payer: Self-pay | Admitting: Radiology

## 2023-05-02 MED ORDER — ROSUVASTATIN CALCIUM 5 MG PO TABS: 5.0000 mg | ORAL_TABLET | Freq: Every day | ORAL | 3 refills | Status: DC

## 2023-05-02 NOTE — Telephone Encounter (Signed)
 Okay to refill?

## 2023-10-24 ENCOUNTER — Ambulatory Visit (INDEPENDENT_AMBULATORY_CARE_PROVIDER_SITE_OTHER): Admitting: Emergency Medicine

## 2023-10-24 ENCOUNTER — Ambulatory Visit (INDEPENDENT_AMBULATORY_CARE_PROVIDER_SITE_OTHER)

## 2023-10-24 ENCOUNTER — Encounter: Payer: Self-pay | Admitting: Emergency Medicine

## 2023-10-24 ENCOUNTER — Ambulatory Visit: Payer: Self-pay | Admitting: Emergency Medicine

## 2023-10-24 VITALS — BP 146/98 | HR 50 | Temp 97.7°F | Ht 71.0 in | Wt 194.0 lb

## 2023-10-24 DIAGNOSIS — M25511 Pain in right shoulder: Secondary | ICD-10-CM

## 2023-10-24 DIAGNOSIS — R030 Elevated blood-pressure reading, without diagnosis of hypertension: Secondary | ICD-10-CM | POA: Insufficient documentation

## 2023-10-24 DIAGNOSIS — M19011 Primary osteoarthritis, right shoulder: Secondary | ICD-10-CM | POA: Diagnosis not present

## 2023-10-24 NOTE — Progress Notes (Signed)
 Larry Cunningham had a blood pressure reading Larry Cunningham 58 y.o.   Chief Complaint  Patient presents with   Shoulder Pain    Patient here for right shoulder pain for about 2 months. States it happened while on the plane as he was getting his luggage from the over carriage. Says it hurts with movement, mentioned he worked out recently and lifting weights seemed to help just a little. Patient mentioned having lower back pain 6 days ago thought it may be kidney related but the pain has gone away and just wanted to follow up on this    HISTORY OF PRESENT ILLNESS: This is a 58 y.o. male complaining of pain to right shoulder that started last June while pulling luggage from the over carriage. Reinjured it again last week end while working out.  Pain worse with lifting or pulling or pushing Over-the-counter NSAIDs helped. Also concerned about blood pressure.  Blood pressure reading at home 2 weeks ago 145/90 No other complaints or medical concerns today.  Shoulder Pain  Pertinent negatives include no fever.     Prior to Admission medications   Medication Sig Start Date End Date Taking? Authorizing Provider  mometasone  (NASONEX ) 50 MCG/ACT nasal spray Place 2 sprays into the nose 2 (two) times daily. As directed Patient taking differently: Place 2 sprays into the nose daily as needed. As directed 11/10/13  Yes Krystal Reyes LABOR, MD  montelukast  (SINGULAIR ) 10 MG tablet Take 1 tablet (10 mg total) by mouth daily. Patient taking differently: Take 10 mg by mouth daily as needed.  11/10/13 10/24/23 Yes Krystal Reyes LABOR, MD  rosuvastatin  (CRESTOR ) 5 MG tablet Take 5 mg by mouth daily.   Yes [provider]  rosuvastatin  (CRESTOR ) 5 MG tablet Take 1 tablet (5 mg total) by mouth daily. 05/02/23  Yes Purcell Emil Schanz, MD    No Known Allergies  Patient Active Problem List   Diagnosis Date Noted   Dyslipidemia 03/27/2022   OSA (obstructive sleep apnea) 11/10/2013   Decreased libido 01/13/2011    RESTLESS LEG SYNDROME 02/08/2007    Past Medical History:  Diagnosis Date   Allergy    Low back pain    Numbness    right leg   RLS (restless legs syndrome)     Past Surgical History:  Procedure Laterality Date   TONSILLECTOMY     tubes in ear      Social History   Socioeconomic History   Marital status: Married    Spouse name: Not on file   Number of children: 2   Years of education: Not on file   Highest education level: Not on file  Occupational History   Not on file  Tobacco Use   Smoking status: Never   Smokeless tobacco: Not on file  Substance and Sexual Activity   Alcohol use: Yes    Alcohol/week: 0.0 standard drinks of alcohol    Comment: 3-4 times per week   Drug use: No   Sexual activity: Not on file  Other Topics Concern   Not on file  Social History Narrative   Not on file   Social Drivers of Health   Financial Resource Strain: Not on file  Food Insecurity: Not on file  Transportation Needs: Not on file  Physical Activity: Not on file  Stress: Not on file  Social Connections: Not on file  Intimate Partner Violence: Not on file    Family History  Problem Relation Age of Onset   Pulmonary Hypertension  Mother    Emphysema Father    Asthma Father    Allergies Father      Review of Systems  Constitutional: Negative.  Negative for chills and fever.  HENT: Negative.  Negative for congestion and sore throat.   Respiratory: Negative.  Negative for cough and shortness of breath.   Cardiovascular: Negative.  Negative for chest pain and palpitations.  Gastrointestinal:  Negative for abdominal pain, diarrhea, nausea and vomiting.  Genitourinary: Negative.  Negative for dysuria and hematuria.  Musculoskeletal:  Positive for joint pain (Right shoulder).  Skin: Negative.  Negative for rash.  Neurological: Negative.  Negative for dizziness and headaches.    Vitals:   10/24/23 1048  BP: (!) 146/98  Pulse: (!) 50  Temp: 97.7 F (36.5 C)   SpO2: 98%    Physical Exam Vitals reviewed.  Constitutional:      Appearance: Normal appearance.  HENT:     Head: Normocephalic.  Eyes:     Extraocular Movements: Extraocular movements intact.  Cardiovascular:     Rate and Rhythm: Normal rate and regular rhythm.     Pulses: Normal pulses.     Heart sounds: Normal heart sounds.  Pulmonary:     Effort: Pulmonary effort is normal.     Breath sounds: Normal breath sounds.  Skin:    General: Skin is warm and dry.     Capillary Refill: Capillary refill takes less than 2 seconds.  Neurological:     General: No focal deficit present.     Mental Status: He is alert and oriented to person, place, and time.    DG Shoulder Right Result Date: 10/24/2023 CLINICAL DATA:  Right shoulder pain after lifting injury. EXAM: RIGHT SHOULDER - 2+ VIEW COMPARISON:  None Available. FINDINGS: Minimal degenerative change of the [facility] joint. Glenohumeral joint is unremarkable. No acute fracture or dislocation. Remainder of the exam is unremarkable. IMPRESSION: 1. No acute findings. 2. Minimal degenerative change of the [facility] joint. Electronically Signed   By: Toribio Agreste M.D.   On: 10/24/2023 11:53     ASSESSMENT & PLAN: Problem List Items Addressed This Visit       Other   Elevated blood pressure reading without diagnosis of hypertension   Elevated blood pressure in the office today and also similar numbers 2 weeks ago at home Recommend daily home blood pressure monitoring for the next couple weeks.  Advised to contact the office or send me a message if numbers persistently elevated. May need medication Dietary approaches to stop hypertension discussed Cardiovascular risks associated with hypertension discussed      Acute pain of right shoulder - Primary   Clinically stable.  No red flag signs or symptoms Differential diagnosis discussed Suspect rotator cuff injury Recommend x-ray today.  Will review images when ready Pain management  discussed Recommend sports medicine evaluation Referral placed today      Relevant Orders   DG Shoulder Right   Ambulatory referral to Sports Medicine   Patient Instructions  Shoulder Pain Many things can cause shoulder pain, including: An injury. Moving the shoulder in the same way again and again (overuse). Joint pain (arthritis). Pain can come from: Swelling and irritation (inflammation) of any part of the shoulder. An injury to: The shoulder joint. Tissues that connect muscle to bone (tendons). Tissues that connect bones to each other (ligaments). Bones. Follow these instructions at home: Watch for changes in your symptoms. Let your doctor know about them. Follow these instructions to help with your  pain. If you have a sling that can be taken off: Wear the sling as told by your doctor. Take it off only as told by your doctor. Check the skin around the sling every day. Tell your doctor if you see problems. Loosen the sling if your fingers: Tingle. Become numb. Become cold. Keep the sling clean. If the sling is not waterproof: Do not let it get wet. Take the sling off when you shower or bathe. Managing pain, stiffness, and swelling  If told, put ice on the painful area. Put ice in a plastic bag. Place a towel between your skin and the bag. Leave the ice on for 20 minutes, 2-3 times a day. Stop putting ice on if it does not help with the pain. If your skin turns bright red, take off the ice right away to prevent skin damage. The risk of damage is higher if you cannot feel pain, heat, or cold. Squeeze a soft ball or a foam pad as much as possible. This prevents swelling in the shoulder. It also helps to strengthen the arm. General instructions Take over-the-counter and prescription medicines only as told by your doctor. Keep all follow-up visits. This will help you avoid any type of permanent shoulder problems. Contact a doctor if: Your pain gets worse. Medicine does  not help your pain. You have new pain in your arm, hand, or fingers. You loosen your sling and your arm, hand, or fingers: Tingle. Are numb. Are swollen. Get help right away if: Your arm, hand, or fingers turn white or blue. This information is not intended to replace advice given to you by your health care provider. Make sure you discuss any questions you have with your health care provider. Document Revised: 08/19/2021 Document Reviewed: 08/19/2021 Elsevier Patient Education  2024 Elsevier Inc.    Emil Schaumann, MD [facility]

## 2023-10-24 NOTE — Assessment & Plan Note (Signed)
 Elevated blood pressure in the office today and also similar numbers 2 weeks ago at home Recommend daily home blood pressure monitoring for the next couple weeks.  Advised to contact the office or send me a message if numbers persistently elevated. May need medication Dietary approaches to stop hypertension discussed Cardiovascular risks associated with hypertension discussed

## 2023-10-24 NOTE — Patient Instructions (Signed)
 Shoulder Pain  Many things can cause shoulder pain, including:  An injury.  Moving the shoulder in the same way again and again (overuse).  Joint pain (arthritis).  Pain can come from:  Swelling and irritation (inflammation) of any part of the shoulder.  An injury to:  The shoulder joint.  Tissues that connect muscle to bone (tendons).  Tissues that connect bones to each other (ligaments).  Bones.  Follow these instructions at home:  Watch for changes in your symptoms. Let your doctor know about them. Follow these instructions to help with your pain.  If you have a sling that can be taken off:  Wear the sling as told by your doctor. Take it off only as told by your doctor.  Check the skin around the sling every day. Tell your doctor if you see problems.  Loosen the sling if your fingers:  Tingle.  Become numb.  Become cold.  Keep the sling clean.  If the sling is not waterproof:  Do not let it get wet.  Take the sling off when you shower or bathe.  Managing pain, stiffness, and swelling    If told, put ice on the painful area.  Put ice in a plastic bag.  Place a towel between your skin and the bag.  Leave the ice on for 20 minutes, 2-3 times a day. Stop putting ice on if it does not help with the pain.  If your skin turns bright red, take off the ice right away to prevent skin damage. The risk of damage is higher if you cannot feel pain, heat, or cold.  Squeeze a soft ball or a foam pad as much as possible. This prevents swelling in the shoulder. It also helps to strengthen the arm.  General instructions  Take over-the-counter and prescription medicines only as told by your doctor.  Keep all follow-up visits. This will help you avoid any type of permanent shoulder problems.  Contact a doctor if:  Your pain gets worse.  Medicine does not help your pain.  You have new pain in your arm, hand, or fingers.  You loosen your sling and your arm, hand, or fingers:  Tingle.  Are numb.  Are swollen.  Get help right away  if:  Your arm, hand, or fingers turn white or blue.  This information is not intended to replace advice given to you by your health care provider. Make sure you discuss any questions you have with your health care provider.  Document Revised: 08/19/2021 Document Reviewed: 08/19/2021  Elsevier Patient Education  2024 [employer].

## 2023-10-24 NOTE — Assessment & Plan Note (Signed)
 Clinically stable.  No red flag signs or symptoms Differential diagnosis discussed Suspect rotator cuff injury Recommend x-ray today.  Will review images when ready Pain management discussed Recommend sports medicine evaluation Referral placed today

## 2023-10-27 IMAGING — CT CT CARDIAC CORONARY ARTERY CALCIUM SCORE
3 series · 14 of 20 positions shown, 16 images · non-contrast
Comparison: None.

Addendum:
:
Cardiovascular Disease Risk stratification

EXAM:
Coronary Calcium Score
TECHNIQUE: A gated, non-contrast computed tomography scan of the heart was
performed using 3mm slice thickness. Axial images were analyzed on a
dedicated workstation. Calcium scoring of the coronary arteries was
performed using the Agatston method.

[Series 2: ax lung · axial · 0.85mm/px · z∈[+66,+178]mm · 5 of 86 slices shown]
[im 15/86  lung]
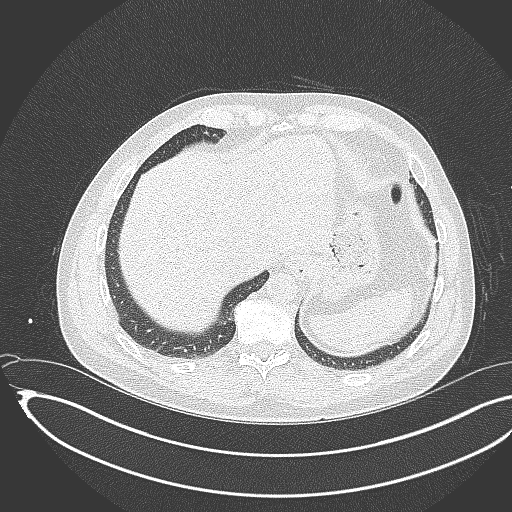
[im 29/86  lung]
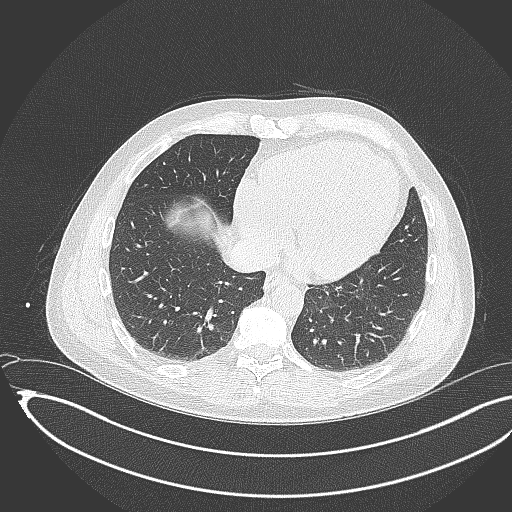
[im 43/86  lung]
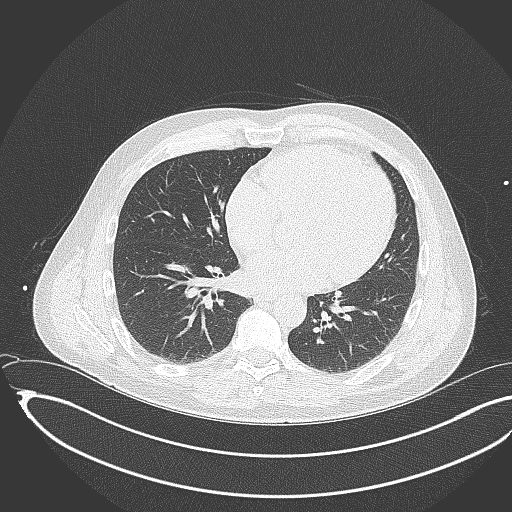
[im 57/86  lung]
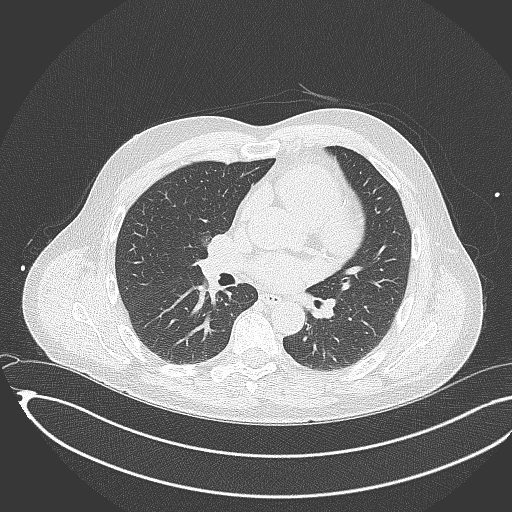
[im 71/86  lung]
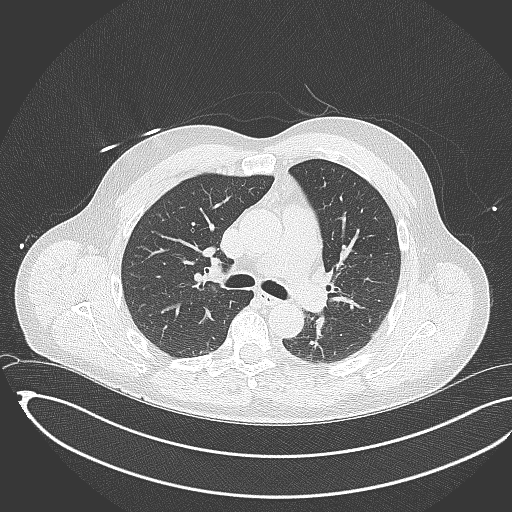

[Series 3: cascseq 3.0 sa36 70% (id) · axial · 0.39mm/px · z∈[+82,+166]mm · 3 of 57 slices shown]
[im 15/57  vessel]
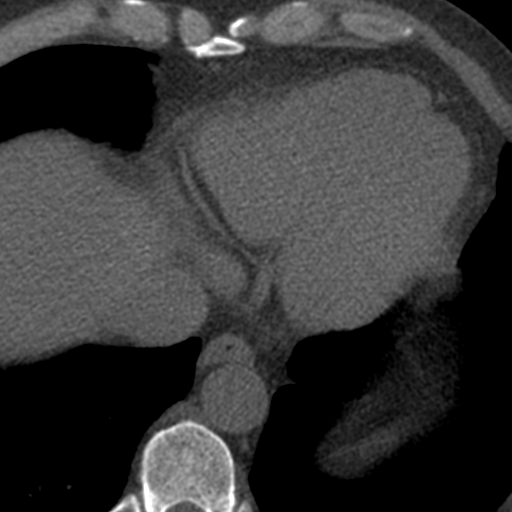
[im 29/57  vessel]
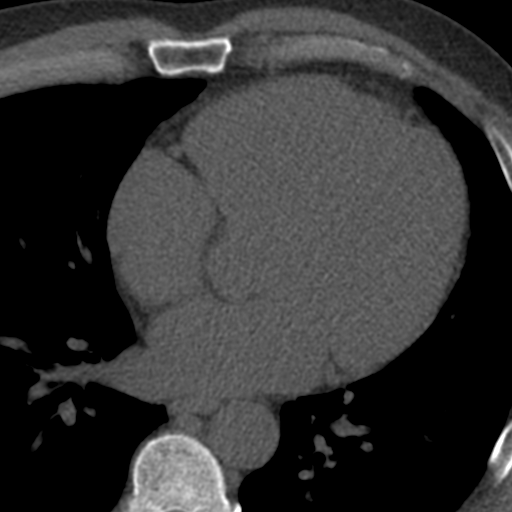
[im 43/57  vessel]
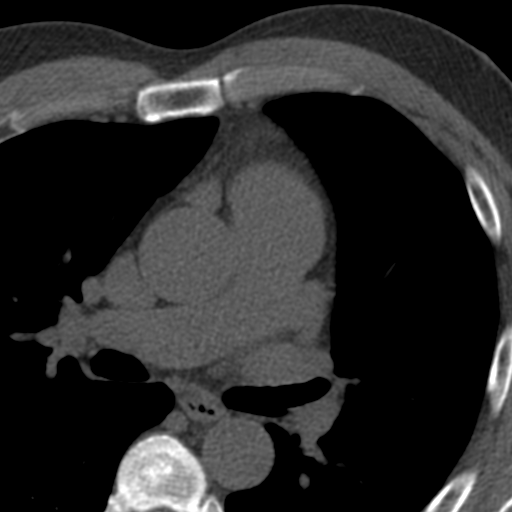

[Series 4: ax st · axial · 0.85mm/px · z∈[+62,+182]mm · 6 of 86 slices shown, 8 images]
[im 13/86  vessel]
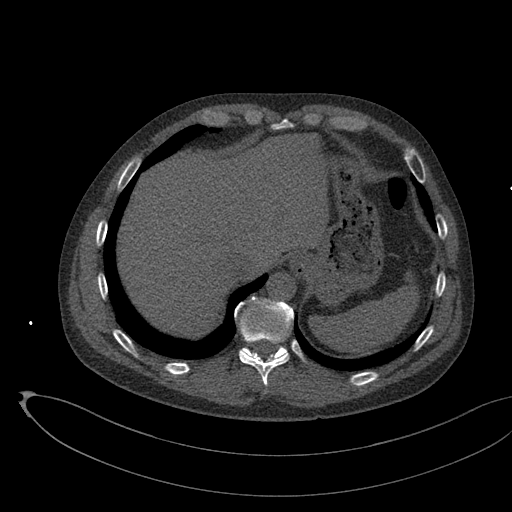
[im 13/86  lung]
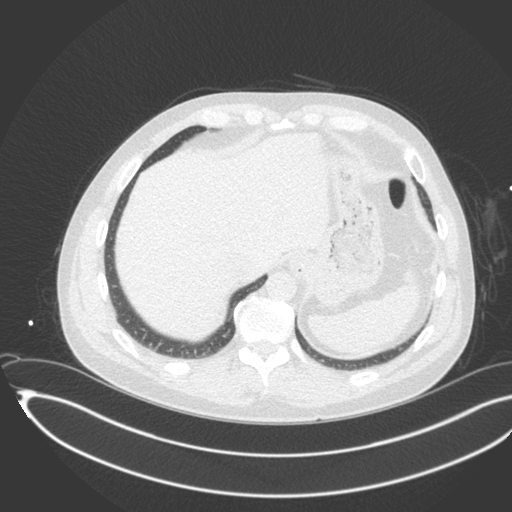
[im 25/86  vessel]
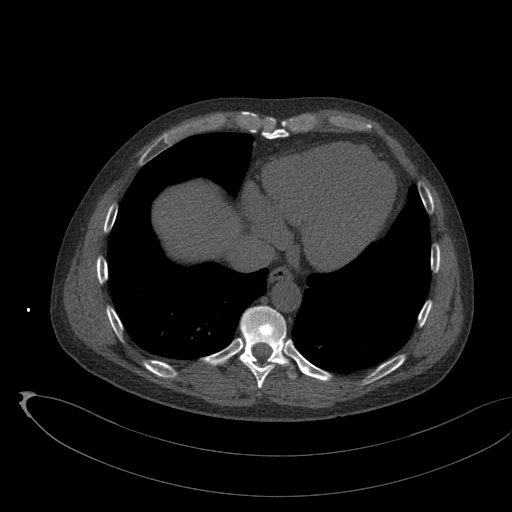
[im 37/86  vessel]
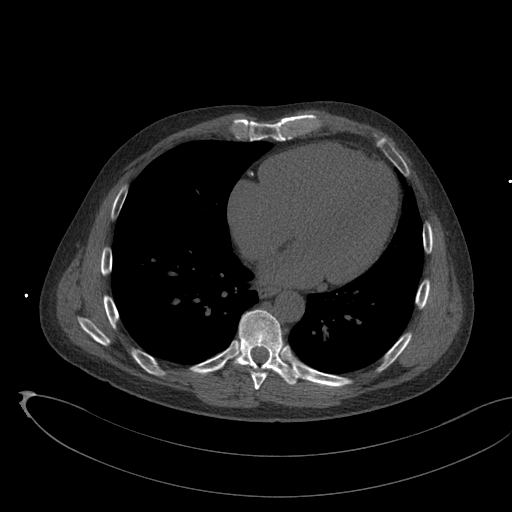
[im 49/86  vessel]
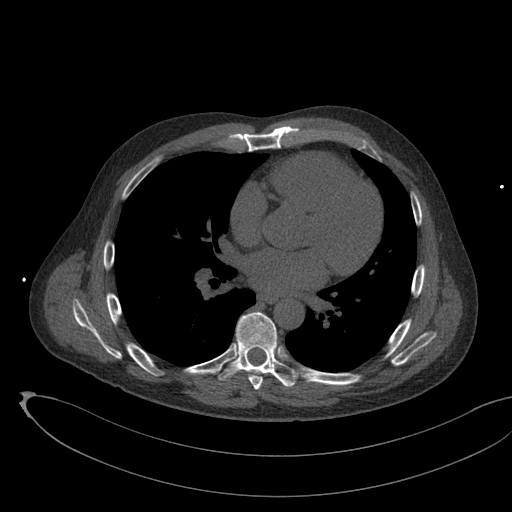
[im 61/86  vessel]
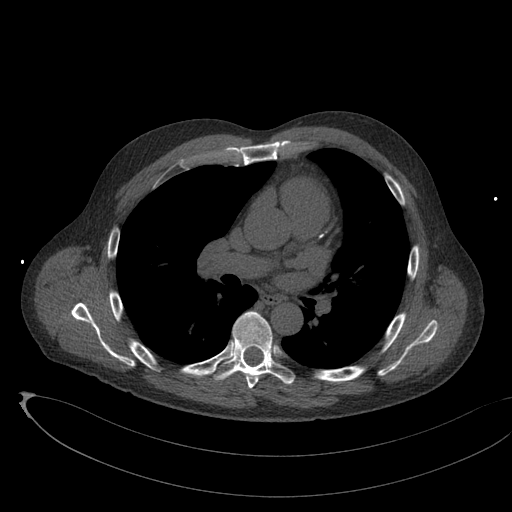
[im 61/86  lung]
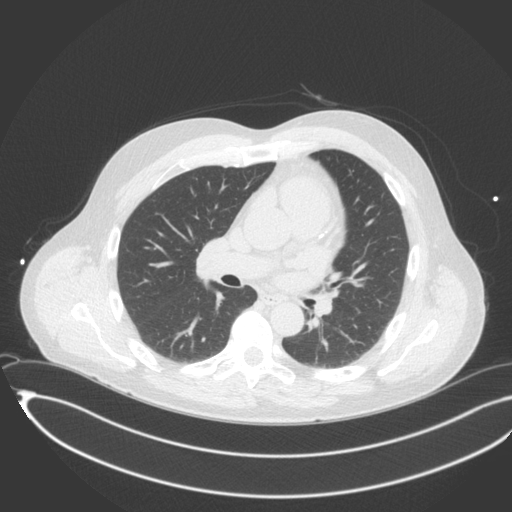
[im 73/86  vessel]
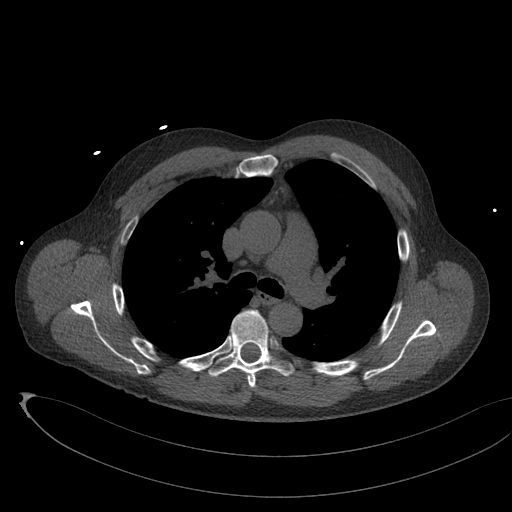

[14 of 20 positions shown; findings below may reference images not displayed]

FINDINGS: Coronary arteries: Normal origins.

Coronary Calcium Score:

Left main: 0

Left anterior descending artery: 126

Left circumflex artery:

Right coronary artery: 0

Total: 150

Percentile: 86

Pericardium: Normal.

Ascending Aorta: Normal caliber.

Non-cardiac: See separate report from [REDACTED].
IMPRESSION: Coronary calcium score of 150. This was 86 percentile for age-,
race-, and sex-matched controls.



If CAC=0, it is reasonable to withhold statin therapy and reassess
in 5 to 10 years, as long as higher risk conditions are absent
(diabetes mellitus, family history of premature CHD in first degree
relatives (males <55 years; females <65 years), cigarette smoking,
or LDL >=190 mg/dL).

If CAC is 1 to 99, it is reasonable to initiate statin therapy for
patients >=55 years of age.

If CAC is >=100 or >=75th percentile, it is reasonable to initiate
statin therapy at any age.

Cardiology referral should be considered for patients with CAC
scores >=400 or >=75th percentile.

*2204 AHA/ACC/AACVPR/AAPA/ABC/JOCELIO/SPEED/ANNEMARIE/Mian/HARIKA/MARZAN/ROBERTO
Guideline on the Management of Blood Cholesterol: A Report of the
American College of Cardiology/American Heart Association Task Force
on Clinical Practice Guidelines. J Am Coll Cardiol.
8842;73(24):6867-6979.

Nhi Marie, DO [REDACTED]

EXAM:
OVER-READ INTERPRETATION  CT CHEST

The following report is an over-read performed by radiologist Dr.
over-read does not include interpretation of cardiac or coronary
anatomy or pathology. The coronary calcium score interpretation by
the cardiologist is attached.
FINDINGS: Top-normal caliber of the central pulmonary arteries with the main
pulmonary artery measuring up to approximately 3.2 cm. Component of
underlying pulmonary hypertension cannot be excluded. Visualized
mediastinum and hilar regions demonstrate no lymphadenopathy or
masses. Visualized lungs show no evidence of pulmonary edema,
consolidation, pneumothorax, nodule or pleural fluid. Visualized
upper abdomen and bony structures are unremarkable.
IMPRESSION: Top-normal/mildly dilated central pulmonary arteries may be
consistent with some degree of underlying pulmonary hypertension.
This is a somewhat nonspecific finding by CT and correlation with
echocardiography may be helpful.

*** End of Addendum ***
:
Cardiovascular Disease Risk stratification

EXAM:
Coronary Calcium Score
FINDINGS: Coronary arteries: Normal origins.

Coronary Calcium Score:

Left main: 0

Left anterior descending artery: 126

Left circumflex artery:

Right coronary artery: 0

Total: 150

Percentile: 86

Pericardium: Normal.

Ascending Aorta: Normal caliber.

Non-cardiac: See separate report from [REDACTED].
IMPRESSION: Coronary calcium score of 150. This was 86 percentile for age-,
race-, and sex-matched controls.



If CAC=0, it is reasonable to withhold statin therapy and reassess
in 5 to 10 years, as long as higher risk conditions are absent
(diabetes mellitus, family history of premature CHD in first degree
relatives (males <55 years; females <65 years), cigarette smoking,
or LDL >=190 mg/dL).

If CAC is 1 to 99, it is reasonable to initiate statin therapy for
patients >=55 years of age.

If CAC is >=100 or >=75th percentile, it is reasonable to initiate
statin therapy at any age.

Cardiology referral should be considered for patients with CAC
scores >=400 or >=75th percentile.

*2204 AHA/ACC/AACVPR/AAPA/ABC/JOCELIO/SPEED/ANNEMARIE/Mian/HARIKA/MARZAN/ROBERTO
Guideline on the Management of Blood Cholesterol: A Report of the
American College of Cardiology/American Heart Association Task Force
on Clinical Practice Guidelines. J Am Coll Cardiol.
8842;73(24):6867-6979.

Nhi Marie, DO [REDACTED]

## 2023-10-30 ENCOUNTER — Other Ambulatory Visit: Payer: Self-pay | Admitting: Emergency Medicine

## 2023-10-30 ENCOUNTER — Encounter: Payer: Self-pay | Admitting: Emergency Medicine

## 2023-10-30 DIAGNOSIS — I10 Essential (primary) hypertension: Secondary | ICD-10-CM

## 2023-10-30 DIAGNOSIS — Z125 Encounter for screening for malignant neoplasm of prostate: Secondary | ICD-10-CM

## 2023-10-30 DIAGNOSIS — R29818 Other symptoms and signs involving the nervous system: Secondary | ICD-10-CM

## 2023-10-30 MED ORDER — VALSARTAN-HYDROCHLOROTHIAZIDE 80-12.5 MG PO TABS: 1.0000 | ORAL_TABLET | Freq: Every day | ORAL | 3 refills | Status: AC

## 2023-10-30 NOTE — Telephone Encounter (Signed)
 Recommend to start blood pressure medication.  New prescription for valsartan HCT sent to pharmacy of record today Recommend sleep studies.  Referral for sleep studies placed today. Blood work orders placed today.

## 2023-10-31 NOTE — Progress Notes (Unsigned)
 Ben Jackson D.[facility] [address] [place] [address] Phone: [phone]   Assessment and Plan:     1. Chronic right shoulder pain (Primary) -Chronic with exacerbation, initial sports medicine visit - 3 months of right shoulder pain most consistent with rotator cuff strain, primarily supraspinatus and infraspinatus tendons based on physical exam - Start HEP for rotator cuff - Start meloxicam 15 mg daily x2 weeks.  If still having pain after 2 weeks, complete 3rd-week of NSAID. May use remaining NSAID as needed once daily for pain control.  Do not to use additional over-the-counter NSAIDs (ibuprofen, naproxen, Advil, Aleve, etc.) while taking prescription NSAIDs.  May use Tylenol [phone] mg 2 to 3 times a day for breakthrough pain.  15 additional minutes spent for educating Therapeutic Home Exercise Program.  This included exercises focusing on stretching, strengthening, with focus on eccentric aspects.   Long term goals include an improvement in range of motion, strength, endurance as well as avoiding reinjury. Patient's frequency would include in 1-2 times a day, 3-5 times a week for a duration of 6-12 weeks. Proper technique shown and discussed handout in great detail with ATC.  All questions were discussed and answered.      Pertinent previous records reviewed include internal medicine note 10/24/2023   Follow Up: 3 weeks for reevaluation.  If no improvement or worsening of symptoms, could consider x-ray versus subacromial CSI versus physical therapy   Subjective:   I, Larry Cunningham, am serving as a Neurosurgeon for Doctor [name]  Chief Complaint: right shoulder pain   HPI:   11/01/2023 Patient is a 58 year old male with right shoulder pain. Patient states pain started end of June when he was pulling down a suitcase from over head bin. Decreased ROM. No meds recent. Is a side sleeper. Pain radiates down the arm. No decrease in  grip strength.    Relevant Historical Information: None pertinent  Additional pertinent review of systems negative.   Current Outpatient Medications:    meloxicam (MOBIC) 15 MG tablet, Take 1 tablet daily for 2 weeks.  If still in pain after 2 weeks, take 1 tablet daily for an additional 1 week., Disp: 30 tablet, Rfl: 0   mometasone  (NASONEX ) 50 MCG/ACT nasal spray, Place 2 sprays into the nose 2 (two) times daily. As directed (Patient taking differently: Place 2 sprays into the nose daily as needed. As directed), Disp: 17 g, Rfl: 11   montelukast  (SINGULAIR ) 10 MG tablet, Take 1 tablet (10 mg total) by mouth daily. (Patient taking differently: Take 10 mg by mouth daily as needed. ), Disp: 100 tablet, Rfl: 3   rosuvastatin  (CRESTOR ) 5 MG tablet, Take 5 mg by mouth daily., Disp: , Rfl:    rosuvastatin  (CRESTOR ) 5 MG tablet, Take 1 tablet (5 mg total) by mouth daily., Disp: 90 tablet, Rfl: 3   valsartan-hydrochlorothiazide (DIOVAN-HCT) 80-12.5 MG tablet, Take 1 tablet by mouth daily., Disp: 90 tablet, Rfl: 3   Objective:     Vitals:   11/01/23 1400  BP: 122/82  Pulse: 78  SpO2: 99%  Weight: 195 lb (88.5 kg)  Height: 5' 11 (1.803 m)      Body mass index is 27.2 kg/m.    Physical Exam:    Gen: Appears well, nad, nontoxic and pleasant Neuro:sensation intact, strength is 5/5 with df/pf/inv/ev, muscle tone wnl Skin: no suspicious lesion or defmority Psych: A&O, appropriate mood and affect  Right shoulder:  No deformity,  swelling or muscle wasting No scapular winging FF 180, abd 180, int 0, ext 90 NTTP over the [place], clavicle, ac, coracoid, biceps groove, humerus, deltoid, trapezius, cervical spine Positive Hawkins, empty can, pain with resisted external rotation Neg neer,  obriens, crossarm, subscap liftoff, speeds Neg ant drawer, sulcus sign, apprehension Negative Spurling's test bilat FROM of neck    Electronically signed by:  Odis Mace D.[facility] 2:16 PM 11/01/23

## 2023-10-31 NOTE — Telephone Encounter (Signed)
 Copied from CRM #[id number]. Topic: General - Call Back - No Documentation >> Oct 31, 2023  1:28 PM Larry Cunningham wrote: Reason for CRM: Pt called to check on status of note he sent to provider on yesterday and requesting a callback [phone] to more information

## 2023-11-01 ENCOUNTER — Ambulatory Visit: Payer: Self-pay | Admitting: Emergency Medicine

## 2023-11-01 ENCOUNTER — Other Ambulatory Visit

## 2023-11-01 ENCOUNTER — Ambulatory Visit: Admitting: Sports Medicine

## 2023-11-01 VITALS — BP 122/82 | HR 78 | Ht 71.0 in | Wt 195.0 lb

## 2023-11-01 DIAGNOSIS — G8929 Other chronic pain: Secondary | ICD-10-CM

## 2023-11-01 DIAGNOSIS — I10 Essential (primary) hypertension: Secondary | ICD-10-CM

## 2023-11-01 DIAGNOSIS — R29818 Other symptoms and signs involving the nervous system: Secondary | ICD-10-CM

## 2023-11-01 DIAGNOSIS — M25511 Pain in right shoulder: Secondary | ICD-10-CM

## 2023-11-01 DIAGNOSIS — Z125 Encounter for screening for malignant neoplasm of prostate: Secondary | ICD-10-CM | POA: Diagnosis not present

## 2023-11-01 LAB — CBC WITH DIFFERENTIAL/PLATELET
Basophils Absolute: 0 K/uL (ref 0.0–0.1)
Basophils Relative: 0.7 % (ref 0.0–3.0)
Eosinophils Absolute: 0 K/uL (ref 0.0–0.7)
Eosinophils Relative: 0.7 % (ref 0.0–5.0)
HCT: 40.4 % (ref 39.0–52.0)
Hemoglobin: 13.5 g/dL (ref 13.0–17.0)
Lymphocytes Relative: 32 % (ref 12.0–46.0)
Lymphs Abs: 1.8 K/uL (ref 0.7–4.0)
MCHC: 33.4 g/dL (ref 30.0–36.0)
MCV: 90.1 fl (ref 78.0–100.0)
Monocytes Absolute: 0.3 K/uL (ref 0.1–1.0)
Monocytes Relative: 5.5 % (ref 3.0–12.0)
Neutro Abs: 3.4 K/uL (ref 1.4–7.7)
Neutrophils Relative %: 61.1 % (ref 43.0–77.0)
Platelets: 276 K/uL (ref 150.0–400.0)
RBC: 4.49 Mil/uL (ref 4.22–5.81)
RDW: 13.7 % (ref 11.5–15.5)
WBC: 5.5 K/uL (ref 4.0–10.5)

## 2023-11-01 LAB — COMPREHENSIVE METABOLIC PANEL WITH GFR
ALT: 25 U/L (ref 0–53)
AST: 30 U/L (ref 0–37)
Albumin: 4.6 g/dL (ref 3.5–5.2)
Alkaline Phosphatase: 46 U/L (ref 39–117)
BUN: 18 mg/dL (ref 6–23)
CO2: 34 meq/L — ABNORMAL HIGH (ref 19–32)
Calcium: 9.5 mg/dL (ref 8.4–10.5)
Chloride: 99 meq/L (ref 96–112)
Creatinine, Ser: 1.06 mg/dL (ref 0.40–1.50)
GFR: 78.09 mL/min (ref >60.00)
Glucose, Bld: 97 mg/dL (ref 70–99)
Potassium: 4.3 meq/L (ref 3.5–5.1)
Sodium: 139 meq/L (ref 135–145)
Total Bilirubin: 0.6 mg/dL (ref 0.2–1.2)
Total Protein: 7.1 g/dL (ref 6.0–8.3)

## 2023-11-01 LAB — LIPID PANEL
Cholesterol: 168 mg/dL (ref 0–200)
HDL: 87.6 mg/dL (ref >39.00)
LDL Cholesterol: 69 mg/dL (ref 0–99)
NonHDL: 80.12
Total CHOL/HDL Ratio: 2
Triglycerides: 58 mg/dL (ref 0.0–149.0)
VLDL: 11.6 mg/dL (ref 0.0–40.0)

## 2023-11-01 LAB — HEMOGLOBIN A1C: Hgb A1c MFr Bld: 5.9 % (ref 4.6–6.5)

## 2023-11-01 LAB — PSA: PSA: 0.91 ng/mL (ref 0.10–4.00)

## 2023-11-01 MED ORDER — MELOXICAM 15 MG PO TABS: ORAL_TABLET | ORAL | 0 refills | Status: AC

## 2023-11-01 NOTE — Patient Instructions (Signed)
-   Start meloxicam  15 mg daily x2 weeks.  If still having pain after 2 weeks, complete 3rd-week of NSAID. May use remaining NSAID as needed once daily for pain control.  Do not to use additional over-the-counter NSAIDs (ibuprofen, naproxen , Advil, Aleve , etc.) while taking prescription NSAIDs.  May use Tylenol  [phone] mg 2 to 3 times a day for breakthrough pain. Shoulder HEP  3 week follow up

## 2023-11-20 NOTE — Progress Notes (Unsigned)
 [facility] [address] [place] [address] Phone: [phone]   Assessment and Plan:     1. Chronic right shoulder pain (Primary) -Chronic with exacerbation, subsequent visit - Overall significant improvement in right shoulder pain most consistent with resolving rotator cuff strain that has improved with relative rest, HEP, meloxicam course.  Patient still having intermittent symptoms with certain activities - Use meloxicam 15 mg daily as needed for breakthrough pain.  Recommend limiting chronic NSAIDs to 1-2 doses per week to prevent long-term side effects. Use Tylenol 500 to 1000 mg tablets 2-3 times a day as needed for day-to-day pain relief.    - Continue HEP - Offered PT at today's visit.  Patient declined, though would consider in the future if no significant improvement    Pertinent previous records reviewed include none   Follow Up: As needed if no improvement or worsening of symptoms.  Could call for meloxicam refill if needed.  Could call for PT referral to [facility].  Could consider subacromial CSI versus x-ray   Subjective:   I, Larry [facility], am serving as a Neurosurgeon for Larry Cunningham   Chief Complaint: right shoulder pain    HPI:    11/01/2023 Patient is a 58 year old male with right shoulder pain. Patient states pain started end of June when he was pulling down a suitcase from over head bin. Decreased ROM. No meds recent. Is a side sleeper. Pain radiates down the arm. No decrease in grip strength.   11/21/2023 Patient states he is feeling better      Relevant Historical Information: None pertinent  Additional pertinent review of systems negative.   Current Outpatient Medications:    meloxicam (MOBIC) 15 MG tablet, Take 1 tablet daily for 2 weeks.  If still in pain after 2 weeks, take 1 tablet daily for an additional 1 week., Disp: 30 tablet, Rfl: 0   mometasone  (NASONEX ) 50 MCG/ACT nasal  spray, Place 2 sprays into the nose 2 (two) times daily. As directed (Patient taking differently: Place 2 sprays into the nose daily as needed. As directed), Disp: 17 g, Rfl: 11   montelukast  (SINGULAIR ) 10 MG tablet, Take 1 tablet (10 mg total) by mouth daily. (Patient taking differently: Take 10 mg by mouth daily as needed. ), Disp: 100 tablet, Rfl: 3   rosuvastatin  (CRESTOR ) 5 MG tablet, Take 5 mg by mouth daily., Disp: , Rfl:    rosuvastatin  (CRESTOR ) 5 MG tablet, Take 1 tablet (5 mg total) by mouth daily., Disp: 90 tablet, Rfl: 3   valsartan-hydrochlorothiazide (DIOVAN-HCT) 80-12.5 MG tablet, Take 1 tablet by mouth daily., Disp: 90 tablet, Rfl: 3   Objective:     Vitals:   11/21/23 1521  BP: 118/82  Pulse: 68  SpO2: 98%  Weight: 198 lb (89.8 kg)  Height: 5' 11 (1.803 m)      Body mass index is 27.62 kg/m.    Physical Exam:    Gen: Appears well, nad, nontoxic and pleasant Neuro:sensation intact, strength is 5/5, muscle tone wnl Skin: no suspicious lesion or defmority Psych: A&O, appropriate mood and affect  Right shoulder:  No deformity, swelling or muscle wasting No scapular winging FF 180, abd 180, int 0, ext 90 NTTP over the Mona, clavicle, ac, coracoid, biceps groove, humerus, deltoid, trapezius, cervical spine Positive Hawkins Neg neer,  , empty can, obriens, crossarm, subscap liftoff, speeds Neg ant drawer, sulcus sign, apprehension Negative Spurling's test bilat FROM  of neck    Electronically signed by:  [facility] 3:28 PM 11/21/23

## 2023-11-21 ENCOUNTER — Ambulatory Visit: Admitting: Sports Medicine

## 2023-11-21 ENCOUNTER — Ambulatory Visit (INDEPENDENT_AMBULATORY_CARE_PROVIDER_SITE_OTHER): Admitting: Sports Medicine

## 2023-11-21 VITALS — BP 118/82 | HR 68 | Ht 71.0 in | Wt 198.0 lb

## 2023-11-21 DIAGNOSIS — M25511 Pain in right shoulder: Secondary | ICD-10-CM

## 2023-11-21 DIAGNOSIS — G8929 Other chronic pain: Secondary | ICD-10-CM

## 2023-11-21 NOTE — Patient Instructions (Signed)
-   Use meloxicam 15 mg daily as needed for breakthrough pain.  Recommend limiting chronic NSAIDs to 1-2 doses per week to prevent long-term side effects. Use Tylenol 500 to 1000 mg tablets 2-3 times a day as needed for day-to-day pain relief.    Call for refill if needed   Continue HEP   Call if you would like  PT referral   As needed follow up

## 2023-12-31 ENCOUNTER — Ambulatory Visit: Payer: BC Managed Care – PPO | Admitting: Emergency Medicine

## 2023-12-31 ENCOUNTER — Encounter: Payer: Self-pay | Admitting: Emergency Medicine

## 2023-12-31 VITALS — BP 112/80 | HR 54 | Temp 97.6°F | Ht 71.0 in | Wt 196.0 lb

## 2023-12-31 DIAGNOSIS — Z23 Encounter for immunization: Secondary | ICD-10-CM | POA: Diagnosis not present

## 2023-12-31 DIAGNOSIS — E785 Hyperlipidemia, unspecified: Secondary | ICD-10-CM | POA: Diagnosis not present

## 2023-12-31 DIAGNOSIS — I10 Essential (primary) hypertension: Secondary | ICD-10-CM | POA: Diagnosis not present

## 2023-12-31 DIAGNOSIS — Z0001 Encounter for general adult medical examination with abnormal findings: Secondary | ICD-10-CM | POA: Diagnosis not present

## 2023-12-31 NOTE — Patient Instructions (Signed)
 Health Maintenance, Male  Adopting a healthy lifestyle and getting preventive care are important in promoting health and wellness. Ask your health care provider about:  The right schedule for you to have regular tests and exams.  Things you can do on your own to prevent diseases and keep yourself healthy.  What should I know about diet, weight, and exercise?  Eat a healthy diet    Eat a diet that includes plenty of vegetables, fruits, low-fat dairy products, and lean protein.  Do not eat a lot of foods that are high in solid fats, added sugars, or sodium.  Maintain a healthy weight  Body mass index (BMI) is a measurement that can be used to identify possible weight problems. It estimates body fat based on height and weight. Your health care provider can help determine your BMI and help you achieve or maintain a healthy weight.  Get regular exercise  Get regular exercise. This is one of the most important things you can do for your health. Most adults should:  Exercise for at least 150 minutes each week. The exercise should increase your heart rate and make you sweat (moderate-intensity exercise).  Do strengthening exercises at least twice a week. This is in addition to the moderate-intensity exercise.  Spend less time sitting. Even light physical activity can be beneficial.  Watch cholesterol and blood lipids  Have your blood tested for lipids and cholesterol at 57 years of age, then have this test every 5 years.  You may need to have your cholesterol levels checked more often if:  Your lipid or cholesterol levels are high.  You are older than 57 years of age.  You are at high risk for heart disease.  What should I know about cancer screening?  Many types of cancers can be detected early and may often be prevented. Depending on your health history and family history, you may need to have cancer screening at various ages. This may include screening for:  Colorectal cancer.  Prostate cancer.  Skin cancer.  Lung  cancer.  What should I know about heart disease, diabetes, and high blood pressure?  Blood pressure and heart disease  High blood pressure causes heart disease and increases the risk of stroke. This is more likely to develop in people who have high blood pressure readings or are overweight.  Talk with your health care provider about your target blood pressure readings.  Have your blood pressure checked:  Every 3-5 years if you are 24-52 years of age.  Every year if you are 3 years old or older.  If you are between the ages of 60 and 72 and are a current or former smoker, ask your health care provider if you should have a one-time screening for abdominal aortic aneurysm (AAA).  Diabetes  Have regular diabetes screenings. This checks your fasting blood sugar level. Have the screening done:  Once every three years after age 66 if you are at a normal weight and have a low risk for diabetes.  More often and at a younger age if you are overweight or have a high risk for diabetes.  What should I know about preventing infection?  Hepatitis B  If you have a higher risk for hepatitis B, you should be screened for this virus. Talk with your health care provider to find out if you are at risk for hepatitis B infection.  Hepatitis C  Blood testing is recommended for:  Everyone born from [birth year] through 1965.  Anyone  with known risk factors for hepatitis C.  Sexually transmitted infections (STIs)  You should be screened each year for STIs, including gonorrhea and chlamydia, if:  You are sexually active and are younger than 57 years of age.  You are older than 57 years of age and your health care provider tells you that you are at risk for this type of infection.  Your sexual activity has changed since you were last screened, and you are at increased risk for chlamydia or gonorrhea. Ask your health care provider if you are at risk.  Ask your health care provider about whether you are at high risk for HIV. Your health care provider  may recommend a prescription medicine to help prevent HIV infection. If you choose to take medicine to prevent HIV, you should first get tested for HIV. You should then be tested every 3 months for as long as you are taking the medicine.  Follow these instructions at home:  Alcohol use  Do not drink alcohol if your health care provider tells you not to drink.  If you drink alcohol:  Limit how much you have to 0-2 drinks a day.  Know how much alcohol is in your drink. In the [place] one drink equals one 12 oz bottle of beer (355 mL), one 5 oz glass of wine (148 mL), or one 1 oz glass of hard liquor (44 mL).  Lifestyle  Do not use any products that contain nicotine or tobacco. These products include cigarettes, chewing tobacco, and vaping devices, such as e-cigarettes. If you need help quitting, ask your health care provider.  Do not use street drugs.  Do not share needles.  Ask your health care provider for help if you need support or information about quitting drugs.  General instructions  Schedule regular health, dental, and eye exams.  Stay current with your vaccines.  Tell your health care provider if:  You often feel depressed.  You have ever been abused or do not feel safe at home.  Summary  Adopting a healthy lifestyle and getting preventive care are important in promoting health and wellness.  Follow your health care provider's instructions about healthy diet, exercising, and getting tested or screened for diseases.  Follow your health care provider's instructions on monitoring your cholesterol and blood pressure.  This information is not intended to replace advice given to you by your health care provider. Make sure you discuss any questions you have with your health care provider.  Document Revised: 06/07/2020 Document Reviewed: 06/07/2020  Elsevier Patient Education  2024 [employer].

## 2023-12-31 NOTE — Assessment & Plan Note (Signed)
 BP Readings from Last 3 Encounters:  12/31/23 112/80  11/21/23 118/82  11/01/23 122/82  Well-controlled hypertension since starting valsartan  HCT Gets a little lightheaded when standing up.  Short-lived. Continue valsartan  HCT 80-12.5 mg daily Cardiovascular risks associated with hypertension discussed Diet and nutrition discussed Normal labs reviewed with patient

## 2023-12-31 NOTE — Assessment & Plan Note (Signed)
 Chronic stable condition. Cardiovascular risks associated with dyslipidemia discussed. Diet and nutrition discussed Recommend to continue rosuvastatin  5 mg daily. Normal lipid profile from 11/01/2023 The 10-year ASCVD risk score (Arnett DK, et al., 2019) is: 3.3%   Values used to calculate the score:     Age: 58 years     Clincally relevant sex: Male     Is Non-Hispanic African American: No     Diabetic: No     Tobacco smoker: No     Systolic Blood Pressure: 112 mmHg     Is BP treated: Yes     HDL Cholesterol: 87.6 mg/dL     Total Cholesterol: 168 mg/dL

## 2023-12-31 NOTE — Progress Notes (Signed)
 [facility] 58 y.o.   Chief Complaint  Patient presents with   Annual Exam    HISTORY OF PRESENT ILLNESS: This is a 58 y.o. male A1A here for annual exam and follow-up of hypertension and dyslipidemia Overall doing well. Has no complaints or medical concerns today.  HPI   Prior to Admission medications   Medication Sig Start Date End Date Taking? Authorizing Provider  meloxicam  (MOBIC ) 15 MG tablet Take 1 tablet daily for 2 weeks.  If still in pain after 2 weeks, take 1 tablet daily for an additional 1 week. 11/01/23  Yes Leonce Katz, DO  mometasone  (NASONEX ) 50 MCG/ACT nasal spray Place 2 sprays into the nose 2 (two) times daily. As directed Patient taking differently: Place 2 sprays into the nose daily as needed. As directed 11/10/13  Yes Krystal Reyes LABOR, MD  montelukast  (SINGULAIR ) 10 MG tablet Take 1 tablet (10 mg total) by mouth daily. Patient taking differently: Take 10 mg by mouth daily as needed.  11/10/13 12/31/23 Yes Krystal Reyes LABOR, MD  rosuvastatin  (CRESTOR ) 5 MG tablet Take 5 mg by mouth daily.   Yes [provider]  valsartan -hydrochlorothiazide  (DIOVAN -HCT) 80-12.5 MG tablet Take 1 tablet by mouth daily. 10/30/23  Yes Purcell Emil Schanz, MD    No Known Allergies  Patient Active Problem List   Diagnosis Date Noted   Essential hypertension 12/31/2023   Dyslipidemia 03/27/2022   OSA (obstructive sleep apnea) 11/10/2013   Decreased libido 01/13/2011   RESTLESS LEG SYNDROME 02/08/2007    Past Medical History:  Diagnosis Date   Allergy    Low back pain    Numbness    right leg   RLS (restless legs syndrome)     Past Surgical History:  Procedure Laterality Date   TONSILLECTOMY     tubes in ear      Social History   Socioeconomic History   Marital status: Married    Spouse name: Not on file   Number of children: 2   Years of education: Not on file   Highest education level: Not on file  Occupational History   Not on file   Tobacco Use   Smoking status: Never   Smokeless tobacco: Not on file  Substance and Sexual Activity   Alcohol use: Yes    Alcohol/week: 0.0 standard drinks of alcohol    Comment: 3-4 times per week   Drug use: No   Sexual activity: Not on file  Other Topics Concern   Not on file  Social History Narrative   Not on file   Social Drivers of Health   Financial Resource Strain: Not on file  Food Insecurity: Not on file  Transportation Needs: Not on file  Physical Activity: Not on file  Stress: Not on file  Social Connections: Not on file  Intimate Partner Violence: Not on file    Family History  Problem Relation Age of Onset   Pulmonary Hypertension Mother    Emphysema Father    Asthma Father    Allergies Father      Review of Systems  Constitutional: Negative.  Negative for chills and fever.  HENT: Negative.  Negative for congestion and sore throat.   Respiratory: Negative.  Negative for cough and shortness of breath.   Cardiovascular: Negative.  Negative for chest pain and palpitations.  Gastrointestinal:  Negative for abdominal pain, diarrhea, nausea and vomiting.  Genitourinary: Negative.  Negative for dysuria and hematuria.  Skin: Negative.  Negative for rash.  Neurological: Negative.  Negative for dizziness and headaches.  All other systems reviewed and are negative.   Vitals:   12/31/23 0801  BP: 112/80  Pulse: (!) 54  Temp: 97.6 F (36.4 C)  SpO2: 98%    Physical Exam Vitals reviewed.  HENT:     Head: Normocephalic.     Mouth/Throat:     Mouth: Mucous membranes are moist.     Pharynx: Oropharynx is clear.  Eyes:     Extraocular Movements: Extraocular movements intact.     Conjunctiva/sclera: Conjunctivae normal.     Pupils: Pupils are equal, round, and reactive to light.  Cardiovascular:     Rate and Rhythm: Normal rate and regular rhythm.     Pulses: Normal pulses.     Heart sounds: Normal heart sounds.  Pulmonary:     Effort: Pulmonary  effort is normal.     Breath sounds: Normal breath sounds.  Musculoskeletal:     Cervical back: No tenderness.  Lymphadenopathy:     Cervical: No cervical adenopathy.  Skin:    General: Skin is warm and dry.     Capillary Refill: Capillary refill takes less than 2 seconds.  Neurological:     General: No focal deficit present.     Mental Status: He is alert and oriented to person, place, and time.  Psychiatric:        Mood and Affect: Mood normal.        Behavior: Behavior normal.      ASSESSMENT & PLAN: Problem List Items Addressed This Visit       Cardiovascular and Mediastinum   Essential hypertension   BP Readings from Last 3 Encounters:  12/31/23 112/80  11/21/23 118/82  11/01/23 122/82  Well-controlled hypertension since starting valsartan  HCT Gets a little lightheaded when standing up.  Short-lived. Continue valsartan  HCT 80-12.5 mg daily Cardiovascular risks associated with hypertension discussed Diet and nutrition discussed Normal labs reviewed with patient         Other   Dyslipidemia   Chronic stable condition. Cardiovascular risks associated with dyslipidemia discussed. Diet and nutrition discussed Recommend to continue rosuvastatin  5 mg daily. Normal lipid profile from 11/01/2023 The 10-year ASCVD risk score (Arnett DK, et al., 2019) is: 3.3%   Values used to calculate the score:     Age: 52 years     Clincally relevant sex: Male     Is Non-Hispanic African American: No     Diabetic: No     Tobacco smoker: No     Systolic Blood Pressure: 112 mmHg     Is BP treated: Yes     HDL Cholesterol: 87.6 mg/dL     Total Cholesterol: 168 mg/dL       Other Visit Diagnoses       Encounter for general adult medical examination with abnormal findings    -  Primary      Modifiable risk factors discussed with patient. Anticipatory guidance according to age provided. The following topics were also discussed: Social Determinants of Health Smoking.   Non-smoker Diet and nutrition.  Good eating habits Benefits of exercise.  Exercises regularly Cancer screening.  Had colonoscopy about 7 years ago.  Told it was normal and return in 10 years Vaccinations.  Requesting influenza and shingles vaccine Cardiovascular risk assessment The 10-year ASCVD risk score (Arnett DK, et al., 2019) is: 3.3%   Values used to calculate the score:     Age: 22 years     Clincally relevant sex:  Male     Is Non-Hispanic African American: No     Diabetic: No     Tobacco smoker: No     Systolic Blood Pressure: 112 mmHg     Is BP treated: Yes     HDL Cholesterol: 87.6 mg/dL     Total Cholesterol: 168 mg/dL Review of chronic medical conditions under management Review of all medications Mental health including depression and anxiety Fall and accident prevention  Patient Instructions  Health Maintenance, Male Adopting a healthy lifestyle and getting preventive care are important in promoting health and wellness. Ask your health care provider about: The right schedule for you to have regular tests and exams. Things you can do on your own to prevent diseases and keep yourself healthy. What should I know about diet, weight, and exercise? Eat a healthy diet  Eat a diet that includes plenty of vegetables, fruits, low-fat dairy products, and lean protein. Do not eat a lot of foods that are high in solid fats, added sugars, or sodium. Maintain a healthy weight Body mass index (BMI) is a measurement that can be used to identify possible weight problems. It estimates body fat based on height and weight. Your health care provider can help determine your BMI and help you achieve or maintain a healthy weight. Get regular exercise Get regular exercise. This is one of the most important things you can do for your health. Most adults should: Exercise for at least 150 minutes each week. The exercise should increase your heart rate and make you sweat (moderate-intensity  exercise). Do strengthening exercises at least twice a week. This is in addition to the moderate-intensity exercise. Spend less time sitting. Even light physical activity can be beneficial. Watch cholesterol and blood lipids Have your blood tested for lipids and cholesterol at 57 years of age, then have this test every 5 years. You may need to have your cholesterol levels checked more often if: Your lipid or cholesterol levels are high. You are older than 57 years of age. You are at high risk for heart disease. What should I know about cancer screening? Many types of cancers can be detected early and may often be prevented. Depending on your health history and family history, you may need to have cancer screening at various ages. This may include screening for: Colorectal cancer. Prostate cancer. Skin cancer. Lung cancer. What should I know about heart disease, diabetes, and high blood pressure? Blood pressure and heart disease High blood pressure causes heart disease and increases the risk of stroke. This is more likely to develop in people who have high blood pressure readings or are overweight. Talk with your health care provider about your target blood pressure readings. Have your blood pressure checked: Every 3-5 years if you are 31-6 years of age. Every year if you are 36 years old or older. If you are between the ages of 74 and 29 and are a current or former smoker, ask your health care provider if you should have a one-time screening for abdominal aortic aneurysm (AAA). Diabetes Have regular diabetes screenings. This checks your fasting blood sugar level. Have the screening done: Once every three years after age 37 if you are at a normal weight and have a low risk for diabetes. More often and at a younger age if you are overweight or have a high risk for diabetes. What should I know about preventing infection? Hepatitis B If you have a higher risk for hepatitis B, you should be  screened for this virus. Talk with your health care provider to find out if you are at risk for hepatitis B infection. Hepatitis C Blood testing is recommended for: Everyone born from [birth year] through 1965. Anyone with known risk factors for hepatitis C. Sexually transmitted infections (STIs) You should be screened each year for STIs, including gonorrhea and chlamydia, if: You are sexually active and are younger than 57 years of age. You are older than 57 years of age and your health care provider tells you that you are at risk for this type of infection. Your sexual activity has changed since you were last screened, and you are at increased risk for chlamydia or gonorrhea. Ask your health care provider if you are at risk. Ask your health care provider about whether you are at high risk for HIV. Your health care provider may recommend a prescription medicine to help prevent HIV infection. If you choose to take medicine to prevent HIV, you should first get tested for HIV. You should then be tested every 3 months for as long as you are taking the medicine. Follow these instructions at home: Alcohol use Do not drink alcohol if your health care provider tells you not to drink. If you drink alcohol: Limit how much you have to 0-2 drinks a day. Know how much alcohol is in your drink. In the [place] one drink equals one 12 oz bottle of beer (355 mL), one 5 oz glass of wine (148 mL), or one 1 oz glass of hard liquor (44 mL). Lifestyle Do not use any products that contain nicotine or tobacco. These products include cigarettes, chewing tobacco, and vaping devices, such as e-cigarettes. If you need help quitting, ask your health care provider. Do not use street drugs. Do not share needles. Ask your health care provider for help if you need support or information about quitting drugs. General instructions Schedule regular health, dental, and eye exams. Stay current with your vaccines. Tell your health care  provider if: You often feel depressed. You have ever been abused or do not feel safe at home. Summary Adopting a healthy lifestyle and getting preventive care are important in promoting health and wellness. Follow your health care provider's instructions about healthy diet, exercising, and getting tested or screened for diseases. Follow your health care provider's instructions on monitoring your cholesterol and blood pressure. This information is not intended to replace advice given to you by your health care provider. Make sure you discuss any questions you have with your health care provider. Document Revised: 06/07/2020 Document Reviewed: 06/07/2020 Elsevier Patient Education  2024 [employer]     Emil Schaumann, MD [facility]
